# Patient Record
Sex: Male | Born: 1960 | Race: White | Hispanic: No | Marital: Married | Smoking: Never smoker
Health system: Southern US, Community
[De-identification: ages and names within clinical notes are randomized; demographics above are authoritative.]

## PROBLEM LIST (undated history)

## (undated) DIAGNOSIS — T7840XA Allergy, unspecified, initial encounter: Secondary | ICD-10-CM

## (undated) DIAGNOSIS — R011 Cardiac murmur, unspecified: Secondary | ICD-10-CM

## (undated) DIAGNOSIS — R0789 Other chest pain: Secondary | ICD-10-CM

## (undated) DIAGNOSIS — I10 Essential (primary) hypertension: Secondary | ICD-10-CM

## (undated) HISTORY — PX: MOLE REMOVAL: SHX2046

## (undated) HISTORY — DX: Other chest pain: R07.89

## (undated) HISTORY — PX: OTHER SURGICAL HISTORY: SHX169

## (undated) HISTORY — PX: COLONOSCOPY: SHX174

## (undated) HISTORY — DX: Allergy, unspecified, initial encounter: T78.40XA

## (undated) HISTORY — DX: Cardiac murmur, unspecified: R01.1

## (undated) HISTORY — DX: Essential (primary) hypertension: I10

---

## 2008-10-17 ENCOUNTER — Ambulatory Visit: Payer: Self-pay | Admitting: Internal Medicine

## 2008-10-17 DIAGNOSIS — I10 Essential (primary) hypertension: Secondary | ICD-10-CM

## 2008-10-17 DIAGNOSIS — J309 Allergic rhinitis, unspecified: Secondary | ICD-10-CM | POA: Insufficient documentation

## 2008-11-10 ENCOUNTER — Ambulatory Visit: Payer: Self-pay | Admitting: Internal Medicine

## 2008-11-10 LAB — CONVERTED CEMR LAB
BUN: 18 mg/dL (ref 6–23)
Basophils Absolute: 0 10*3/uL (ref 0.0–0.1)
Chloride: 103 meq/L (ref 96–112)
Cholesterol: 168 mg/dL (ref 0–200)
GFR calc non Af Amer: 75.6 mL/min (ref >60)
Glucose, Bld: 90 mg/dL (ref 70–99)
Glucose, Urine, Semiquant: NEGATIVE
HCT: 44.4 % (ref 39.0–52.0)
Ketones, urine, test strip: NEGATIVE
Lymphs Abs: 1.9 10*3/uL (ref 0.7–4.0)
MCV: 89.7 fL (ref 78.0–100.0)
Monocytes Absolute: 0.4 10*3/uL (ref 0.1–1.0)
Platelets: 222 10*3/uL (ref 150.0–400.0)
Potassium: 4.1 meq/L (ref 3.5–5.1)
Protein, U semiquant: NEGATIVE
RDW: 12 % (ref 11.5–14.6)
TSH: 0.8 microintl units/mL (ref 0.35–5.50)
Total Bilirubin: 1.1 mg/dL (ref 0.3–1.2)
Triglycerides: 95 mg/dL (ref 0.0–149.0)
VLDL: 19 mg/dL (ref 0.0–40.0)
pH: 5.5

## 2008-11-22 ENCOUNTER — Ambulatory Visit: Payer: Self-pay | Admitting: Internal Medicine

## 2008-11-22 DIAGNOSIS — R011 Cardiac murmur, unspecified: Secondary | ICD-10-CM | POA: Insufficient documentation

## 2008-12-04 ENCOUNTER — Encounter: Payer: Self-pay | Admitting: Internal Medicine

## 2008-12-04 ENCOUNTER — Ambulatory Visit: Payer: Self-pay

## 2009-02-21 ENCOUNTER — Ambulatory Visit: Payer: Self-pay | Admitting: Internal Medicine

## 2009-09-25 ENCOUNTER — Ambulatory Visit: Payer: Self-pay | Admitting: Internal Medicine

## 2009-09-25 LAB — CONVERTED CEMR LAB
Albumin: 4.3 g/dL (ref 3.5–5.2)
Alkaline Phosphatase: 40 units/L (ref 39–117)
Basophils Absolute: 0 10*3/uL (ref 0.0–0.1)
Blood in Urine, dipstick: NEGATIVE
CO2: 28 meq/L (ref 19–32)
Calcium: 9.3 mg/dL (ref 8.4–10.5)
Creatinine, Ser: 1.2 mg/dL (ref 0.4–1.5)
Eosinophils Absolute: 0.1 10*3/uL (ref 0.0–0.7)
Glucose, Bld: 84 mg/dL (ref 70–99)
Glucose, Urine, Semiquant: NEGATIVE
HDL: 41.9 mg/dL (ref >39.00)
Hemoglobin: 14.9 g/dL (ref 13.0–17.0)
Ketones, urine, test strip: NEGATIVE
Lymphocytes Relative: 32.6 % (ref 12.0–46.0)
Lymphs Abs: 1.6 10*3/uL (ref 0.7–4.0)
MCHC: 35.1 g/dL (ref 30.0–36.0)
MCV: 92.3 fL (ref 78.0–100.0)
Monocytes Absolute: 0.4 10*3/uL (ref 0.1–1.0)
Neutro Abs: 2.7 10*3/uL (ref 1.4–7.7)
RDW: 12.6 % (ref 11.5–14.6)
Sodium: 141 meq/L (ref 135–145)
Specific Gravity, Urine: >=1.03
TSH: 0.8 microintl units/mL (ref 0.35–5.50)
Triglycerides: 77 mg/dL (ref 0.0–149.0)
pH: 5.5

## 2009-10-09 ENCOUNTER — Ambulatory Visit: Payer: Self-pay | Admitting: Internal Medicine

## 2009-10-15 ENCOUNTER — Ambulatory Visit: Payer: Self-pay | Admitting: Cardiology

## 2009-10-15 DIAGNOSIS — E669 Obesity, unspecified: Secondary | ICD-10-CM

## 2009-10-25 ENCOUNTER — Ambulatory Visit: Payer: Self-pay | Admitting: Cardiology

## 2009-10-25 ENCOUNTER — Ambulatory Visit: Payer: Self-pay

## 2010-04-09 ENCOUNTER — Telehealth: Payer: Self-pay | Admitting: *Deleted

## 2010-05-21 NOTE — Assessment & Plan Note (Signed)
Summary: np6/ mumur, pt has state bcbs/ gd   Visit Type:  Initial Consult Primary Provider:  Madelin Headings MD  CC:  Murmur.  History of Present Illness: The patient presents for evaluation of a murmur. He was told he had this as a young child and he was evaluated. He said he eventually grew out of this. He never had any symptoms and denied any palpitations, presyncope or syncope. He had no limitations and did martial arts. More recently he has again been discovered to have a heart murmur. He apparently has gotten milder over the years. He was recently referred for an echocardiogram which demonstrated some septal hypertrophy. There was no reported transvalvular gradient and no mention of soft valvular obstruction. Septal width was 13.26 mm. There were no valvular abnormalities. He was referred for evaluation of this.  He does a lot of lifting at work but is not particularly active. He will notice shortness of breath running up a flight of stairs but has no presyncope or syncope. He has no chest pressure, neck or arm discomfort. He has no PND or orthopnea. He has no weight gain or edema.  Current Medications (verified): 1)  Lisinopril-Hydrochlorothiazide 20-12.5 Mg Tabs (Lisinopril-Hydrochlorothiazide) .Marland Kitchen.. 1 By Mouth Once Daily  or As Directed  Allergies (verified): No Known Drug Allergies  Past History:  Past Medical History: Hypertension Allergic rhinitis Heart  murmur as a child . Echo 11/2008  midl concentric  hypertrophy  no valve obstuction   Past Surgical History: Dental work done at 50 years old Cyst removal on right pinky finger 1995 Mole removal  benign   Family History: Mother: diabetes, hypertension Father: hypertension Brother: hypertension GM breast cancer  Arthrtis  Son with anxiety and ADD.  Social History: Household of 3  Works[employer], fashion/clothing.  Married Rare alcohol no tobacco  Bachelors degree  [employer]   Review of Systems       As stated in the HPI  and negative for all other systems.   Vital Signs:  Patient profile:   50 year old male Height:      70.5 inches Weight:      215 pounds BMI:     30.52 Pulse rate:   65 / minute Pulse rhythm:   regular BP sitting:   98 / 60  (left arm) Cuff size:   large  Vitals Entered By: Danielle Rankin, CMA (October 15, 2009 3:42 PM)  Physical Exam  General:  Well developed, well nourished, in no acute distress. Head:  normocephalic and atraumatic Eyes:  PERRLA/EOM intact; conjunctiva and lids normal. Mouth:  Teeth, gums and palate normal. Oral mucosa normal. Neck:  Neck supple, no JVD. No masses, thyromegaly or abnormal cervical nodes. Chest Wall:  no deformities or breast masses noted Lungs:  Clear bilaterally to auscultation and percussion. Abdomen:  Bowel sounds positive; abdomen soft and non-tender without masses, organomegaly, or hernias noted. No hepatosplenomegaly. Msk:  Back normal, normal gait. Muscle strength and tone normal. Extremities:  No clubbing or cyanosis. Neurologic:  Alert and oriented x 3. Skin:  Intact without lesions or rashes. Cervical Nodes:  no significant adenopathy Inguinal Nodes:  no significant adenopathy Psych:  Normal affect.   Detailed Cardiovascular Exam  Neck    Carotids: Carotids full and equal bilaterally without bruits.      Neck Veins: Normal, no JVD.    Heart    Inspection: no deformities or lifts noted.      Palpation: normal PMI with no thrills  palpable.      Auscultation: S1 and S2 within normal limits, no S3, no S4, no clicks, no rubs, 3/6 apical systolic murmur radiating up the aortic outflow tract and increasing with the strain phase of Valsalva  Vascular    Abdominal Aorta: no palpable masses, pulsations, or audible bruits.      Femoral Pulses: normal femoral pulses bilaterally.      Pedal Pulses: normal pedal pulses bilaterally.      Radial Pulses: normal radial pulses bilaterally.      Peripheral Circulation: no clubbing, cyanosis, or  edema noted with normal capillary refill.     Impression & Recommendations:  Problem # 1:  SYSTOLIC MURMUR (FIE-332.2)  The patient has hypertrophic cardiomyopathy with a classic murmur. There was no evidence of obstruction on echocardiography and he has no symptoms. I will bring him back to do a treadmill to look for high risk features but suspect I will follow this clinically. I gave him literature and discussed with him in detail the physiology of this. I carefully investigated for any family history and there is none.  His updated medication list for this problem includes:    Lisinopril-hydrochlorothiazide 20-12.5 Mg Tabs (Lisinopril-hydrochlorothiazide) .Marland Kitchen... 1 by mouth once daily  or as directed  Problem # 2:  HYPERTENSION (ICD-401.9)  His updated medication list for this problem includes:    Lisinopril-hydrochlorothiazide 20-12.5 Mg Tabs (Lisinopril-hydrochlorothiazide) .Marland Kitchen... 1 by mouth once daily  or as directed  Problem # 3:  OBESITY, UNSPECIFIED (ICD-278.00) His BMI puts him in a mildly obese range and we discussed this. We will discuss this further at the time of his treadmill.  Patient Instructions: 1)  Your physician has requested that you have an exercise tolerance test.  For further information please visit [email or web address]  Please also follow instruction sheet, as given.   first available with Dr. Antoine Poche. 2)  Hypertrophic Cardiomyopathy 3)  Please visit  [email or web address]  for detailed information on

## 2010-05-21 NOTE — Assessment & Plan Note (Signed)
Summary: cpx//lh RSC BMP OK PER KIM/NJR   Vital Signs:  Patient profile:   50 year old male Height:      70.5 inches Weight:      213 pounds BMI:     30.24 Pulse rate:   78 / minute BP sitting:   120 / 60  (right arm) Cuff size:   regular  Vitals Entered By: Romualdo Bolk, CMA (AAMA) (October 09, 2009 9:41 AM)  Nutrition Counseling: Patient's BMI is greater than 25 and therefore counseled on weight management options. CC: CPX   History of Present Illness: Paul Yoder comes in today  for preventive visit  Has a BS form to complete.  Ankle sprain   with weight gain from decrease exercise .  recently  doing better .  Had done well with weight loss with weight watchers in the past . But gained back  No new concerns otherwise . Going to [employer] camp (not high adventure. ) average activity  HT: med doing well  readings good and  no se discussed.   Preventive Care Screening  Prior Values:    Last Tetanus Booster:  Tdap (10/17/2008)   Preventive Screening-Counseling & Management  Alcohol-Tobacco     Alcohol drinks/day: <1     Alcohol type: wine     Smoking Status: never  Caffeine-Diet-Exercise     Caffeine use/day: one a week     Does Patient Exercise: yes     Type of exercise: walking  Hep-HIV-STD-Contraception     Dental Visit-last 6 months yes     Sun Exposure-Excessive: no  Safety-Violence-Falls     Seat Belt Use: yes     Firearms in the Home: firearms in the home     Firearm Counseling: not indicated; uses recommended firearm safety measures     Smoke Detectors: yes  Current Medications (verified): 1)  Lisinopril-Hydrochlorothiazide 20-12.5 Mg Tabs (Lisinopril-Hydrochlorothiazide) .Marland Kitchen.. 1 By Mouth Once Daily  or As Directed  Allergies (verified): No Known Drug Allergies  Past History:  Past medical, surgical, family and social histories (including risk factors) reviewed, and no changes noted (except as noted below).  Past Medical  History: Hypertension Allergic rhinitis heart   murmur as a child . Echo 11/2008  midl concentric  hypertrophy  no valve obstuction   Past Surgical History: Reviewed history from 11/22/2008 and no changes required. dental work done at 50 years old cyst removal on right pinky finger 1995 mole removal  benign   Past History:  Care Management: Dermatology: Posey Rea of name- couple of years ago  Family History: Reviewed history from 10/17/2008 and no changes required. mother: diabetes, hypertension father: hypertension brother: hypertension GM breast cancer  Arthrtis  Son with anxiety and ADD.  Social History: Reviewed history from 10/17/2008 and no changes required. 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  Married rare alcohol no tobacco  Bachelors degree  Scouting   Review of Systems  The patient denies anorexia, fever, weight loss, vision loss, decreased hearing, hoarseness, chest pain, syncope, peripheral edema, prolonged cough, abdominal pain, melena, hematochezia, severe indigestion/heartburn, hematuria, incontinence, genital sores, muscle weakness, suspicious skin lesions, transient blindness, difficulty walking, unusual weight change, abnormal bleeding, enlarged lymph nodes, and testicular masses.         no cp sob except after gaining weigh related to wearing tighter fittingclothes  Physical Exam General Appearance: well developed, well nourished, no acute distress Eyes: conjunctiva and lids normal, PERRLA, EOMI, WNL Ears, Nose, Mouth, Throat: TM clear, nares  clear, oral exam WNL glasses  Neck: supple, no lymphadenopathy, no thyromegaly, no JVD Respiratory: clear to auscultation and percussion, respiratory effort normal Cardiovascular: regular rate and rhythm, S1-S2, Pan systolic murmur  through out precordium without change with  postion , not to neck , no  rub or gallop, no bruits, peripheral pulses intact  not particulalry bounding  and symmetric, no cyanosis, clubbing,  edema or varicosities no jvd Chest: no scars, masses, tenderness; no asymmetry, skin changes, nipple discharge, no gynecomastia   Gastrointestinal: soft, non-tender; no hepatosplenomegaly, masses; active bowel sounds all quadrants, ; no masses, tenderness, perianal tag  Genitourinary: no hernia, testicular mass, penile discharge, priapism or prostate enlargement Lymphatic: no cervical, axillary or inguinal adenopathy Musculoskeletal: gait normal, muscle tone and strength WNL, no joint swelling, effusions, discoloration, crepitus  Skin: clear, good turgor, color WNL, no rashes, lesions, or ulcerations Neurologic: normal mental status, normal reflexes, normal strength, sensation, and motion Psychiatric: alert; oriented to person, place and time Other Exam:  see labs     Impression & Recommendations:  Problem # 1:  HEALTH MAINTENANCE EXAM (ICD-V70.0) Discussed nutrition,exercise,diet,healthy weight, vitamin D and calcium.    agree with weight loss  to help Cv risk and health. Can call urology for vascectomy.  contact us if needed about this.   Problem # 2:  HYPERTENSION (ICD-401.9) Assessment: Unchanged  His updated medication list for this problem includes:    Lisinopril-hydrochlorothiazide 20-12.5 Mg Tabs (Lisinopril-hydrochlorothiazide) .Marland Kitchen... 1 by mouth once daily  or as directed  BP today: 120/60 Prior BP: 100/70 (02/21/2009)  Prior 10 Yr Risk Heart Disease: 7 % (02/21/2009)  Labs Reviewed: K+: 4.4 (09/25/2009) Creat: : 1.2 (09/25/2009)   Chol: 193 (09/25/2009)   HDL: 41.90 (09/25/2009)   LDL: 136 (09/25/2009)   TG: 77.0 (09/25/2009)  Problem # 3:  SYSTOLIC MURMUR (UEA-540.2) Assessment: Deteriorated exam of murmur  is now impressive and heard throughout  chest  in all positions but nl pulse   and no thrill   . no obstructive sx by hx  Last  echo was august 2010 . Needs further evaluation  / cardiology referral.  Orders: Cardiology Referral (Cardiology)  Complete Medication  List: 1)  Lisinopril-hydrochlorothiazide 20-12.5 Mg Tabs (Lisinopril-hydrochlorothiazide) .Marland Kitchen.. 1 by mouth once daily  or as directed  Patient Instructions: 1)  since your murmur is more prominent rec see cardiology.  2)  Routine acitivity is ok 3)  agree with weight loss  weight watchers   program. 4)  Continue  bp medication 5)  colonscopy   screening   rec when turning 49 years   6)  can call for referral when convenient.  7)  Plan rov in 6 months for BP   and as per cardiology rec.

## 2010-05-23 NOTE — Progress Notes (Signed)
Summary: refill  Phone Note Refill Request Call back at Home Phone [phone] Message from:  Patient---live call  Refills Requested: Medication #1:  LISINOPRIL-HYDROCHLOROTHIAZIDE 20-12.5 MG TABS 1 by mouth once daily  or as directed. [facility]  Initial call taken by: Warnell Forester,  April 09, 2010 1:32 PM  Follow-up for Phone Call        Rx sent to pharmacy. Left message for pt to call back. Pt needs to schedule a follow up appt. Follow-up by: Romualdo Bolk, CMA Duncan Dull),  April 09, 2010 3:51 PM  Additional Follow-up for Phone Call Additional follow up Details #1::        Pt aware and will call back to schedule a follow up on bp Additional Follow-up by: Romualdo Bolk, CMA (AAMA),  April 17, 2010 12:35 PM    Prescriptions: LISINOPRIL-HYDROCHLOROTHIAZIDE 20-12.5 MG TABS (LISINOPRIL-HYDROCHLOROTHIAZIDE) 1 by mouth once daily  or as directed  #90 x 0   Entered by:   Romualdo Bolk, CMA (AAMA)   Authorized by:   Madelin Headings MD   Signed by:   Romualdo Bolk, CMA (AAMA) on 04/09/2010   Method used:   Electronically to        [id number] (retail)       [address]       [place], [place]  [address]       Ph: [phone]       Fax: [phone]   RxID:   [id number]

## 2010-07-05 ENCOUNTER — Encounter: Payer: Self-pay | Admitting: Internal Medicine

## 2010-07-12 ENCOUNTER — Encounter: Payer: Self-pay | Admitting: Internal Medicine

## 2010-07-12 ENCOUNTER — Ambulatory Visit (INDEPENDENT_AMBULATORY_CARE_PROVIDER_SITE_OTHER): Payer: BC Managed Care – PPO | Admitting: Internal Medicine

## 2010-07-12 VITALS — BP 150/94 | HR 60 | Wt 215.0 lb

## 2010-07-12 DIAGNOSIS — I10 Essential (primary) hypertension: Secondary | ICD-10-CM

## 2010-07-12 DIAGNOSIS — E669 Obesity, unspecified: Secondary | ICD-10-CM

## 2010-07-12 DIAGNOSIS — I422 Other hypertrophic cardiomyopathy: Secondary | ICD-10-CM

## 2010-07-12 MED ORDER — LISINOPRIL 10 MG PO TABS: 10.0000 mg | ORAL_TABLET | Freq: Every day | ORAL | Status: DC

## 2010-07-12 NOTE — Patient Instructions (Addendum)
Check bp readings as directed . Low dose  Me as discussed  Losing weight will help. Cpx in June    2012  With labs  Call in meantime  if needed .

## 2010-07-12 NOTE — Assessment & Plan Note (Signed)
Counseled consider weight watchers on line others

## 2010-07-12 NOTE — Progress Notes (Signed)
  Subjective:    Patient ID: Paul Yoder, male    DOB: 1960-09-14, 50 y.o.   MRN: [id number]  HPI Pt comes in today for above followup of blood pressure medication. Since his last visit in the summer he has seen Dr. Ivette Loyal crying in regard to his heart murmur and was confirmed diagnosed as mild HCM   Without symptoms with  negative ETT.   Since that time he has decreased his blood pressure medication and then stopped it. He was taking a decreased dose but taking it every other day. His blood pressure readings were 120/80 on the medication. However he stopped it a month ago because he ran out and hasn't checked his readings down but he feels well.   He hasn't been able to  Lose the weight but plans on trying. Past Medical History  Diagnosis Date  . Allergy     seasonal  . Hypertension   . Heart murmur     HCM mild neg ett 2011   History reviewed. No pertinent past surgical history.  reports that he has never smoked. He does not have any smokeless tobacco history on file. He reports that he drinks alcohol. His drug history not on file. family history includes ADD / ADHD in his son; Anxiety disorder in his son; Diabetes in his mother; and Hypertension in his brother and father. No Known Allergies    Review of Systems  negative for chest pain shortness of breath exercise intolerance no palpitations or syncope. No change in GI habits. No unusual bleeding.    Objective:   Physical Exam Wt Readings from Last 3 Encounters:  07/12/10 215 lb (97.523 kg)  10/15/09 215 lb (97.523 kg)  10/09/09 213 lb (96.616 kg)    well-developed well-nourished in no acute distress HEENT is grossly normal neck supple without masses  Or bruits. Chest:  Clear to A&P without wheezes rales or rhonchi CV:  S1-S2 no gallops  peripheral perfusion is normal There is a 2/6 mid to late systolic murmur at the left upper sternal border but possibly increases with Valsalva. More in upright position. No thrills.   No  clubbing cyanosis or edema  Abdomen:  Sof,t normal bowel sounds without hepatosplenomegaly, no guarding rebound or masses no CVA tenderness Neuro non focal          Assessment & Plan:   hypertension:     Readings her back up somewhat since he has gone off the medication. He hasn't lost weight which could help control. We had a significant conversation about medication choices and risk and benefit of using diuretics and his HCM condition. And even ACE inhibitors may be an issue at times. We discussed possibility of beta blockers and for  Verapamil if needed however he has no symptoms with his condition.   obesity by BMI     Discussed strategies exercise possibly Weight Watchers. Losing weight will help his condition.   hypertrophiccardiomyopathy   Asymptomatic we'll get followup with cardiology by echo yearly. Or as appropriate    More than 50% of visit  Was spent in counseling  25 minutes

## 2010-07-12 NOTE — Assessment & Plan Note (Signed)
From HCM

## 2010-07-12 NOTE — Assessment & Plan Note (Signed)
Echo with mild septal hypertrophy and no sx .  ETT showed good tolerance and no sx last summer 2011 . He had been on Lis/ hctz 20 12.5  And bp good.  Took every other day to decrease dose  And then off for 1 months. Neverhad any sx of palpitations syncope or. Disc risk benefit to have caution  With diuretics and even ace .  Vs b blockers and verapamil .    But will see how bp control is on low dose ace.  He will lose weight intensify lsi .    And close   Follow up.

## 2010-09-27 ENCOUNTER — Telehealth: Payer: Self-pay | Admitting: Internal Medicine

## 2010-09-27 NOTE — Telephone Encounter (Signed)
Pt is needing to get a camp cpx before 10/20/10. Pls advise.

## 2010-09-27 NOTE — Telephone Encounter (Signed)
Per Dr. Fabian Sharp- okay to do 10/18/10 at 9:15am Pt aware of appt.

## 2010-10-18 ENCOUNTER — Ambulatory Visit (INDEPENDENT_AMBULATORY_CARE_PROVIDER_SITE_OTHER): Payer: BC Managed Care – PPO | Admitting: Internal Medicine

## 2010-10-18 ENCOUNTER — Encounter: Payer: Self-pay | Admitting: Internal Medicine

## 2010-10-18 VITALS — BP 130/86 | HR 60 | Ht 70.75 in | Wt 210.0 lb

## 2010-10-18 DIAGNOSIS — I422 Other hypertrophic cardiomyopathy: Secondary | ICD-10-CM

## 2010-10-18 DIAGNOSIS — Z1211 Encounter for screening for malignant neoplasm of colon: Secondary | ICD-10-CM

## 2010-10-18 DIAGNOSIS — I10 Essential (primary) hypertension: Secondary | ICD-10-CM

## 2010-10-18 DIAGNOSIS — Z0000 Encounter for general adult medical examination without abnormal findings: Secondary | ICD-10-CM

## 2010-10-18 DIAGNOSIS — E669 Obesity, unspecified: Secondary | ICD-10-CM

## 2010-10-18 MED ORDER — LISINOPRIL 10 MG PO TABS: 10.0000 mg | ORAL_TABLET | Freq: Every day | ORAL | Status: DC

## 2010-10-18 NOTE — Progress Notes (Signed)
  Subjective:    Patient ID: Paul Yoder, male    DOB: 05/22/1960, 50 y.o.   MRN: [id number]  HPI  Patient  Comes in for PV  And fu of other issues . 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 Since last visit has been well but  Had ed visit for a pulled chest wall muscle  When reached back at work .   Had bad muscle spasm. Then had early am cp sob so went to [facility]  ed .   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   Was given Morphine in ed.  And got resp depression and bradycardia.  27 .  rec to need 1/2 dose of morphine if needed in the future.   Review of Systems ROS:  GEN/ HEENTNo fever, significant weight changes sweats headaches vision problems hearing changes, CV/ PULM; No currrent chest pain  shortness of breath cough, syncope,edema  change in exercise tolerance. GI /GU: No adominal pain, vomiting, change in bowel habits. No blood in the stool. No significant GU symptoms. SKIN/HEME: ,no acute skin rashes suspicious lesions or bleeding. No lymphadenopathy, nodules, masses.  NEURO/ PSYCH:  No neurologic signs such as weakness numbness No depression anxiety. IMM/ Allergy: No unusual infections.  Allergy .   REST of 12 system review negative     Objective:   Physical Exam Physical Exam: Vital signs reviewed WUJ:WJXB is a well-developed well-nourished alert cooperative  White male  who appears   stated age in no acute distress.  HEENT: normocephalic  traumatic , Eyes: PERRL EOM's full, conjunctiva clear, Nares: patent no deformity discharge or tenderness., Ears: no deformity EAC's clear TMs with normal landmarks. Mouth: clear OP, no lesions, edema.  Moist mucous membranes. Dentition in adequate repair. NECK: supple without masses, thyromegaly or bruits. CHEST/PULM:  Clear to auscultation and percussion breath sounds equal no wheeze , rales or rhonchi. No chest wall deformities or tenderness. CV: PMI is nondisplaced, S1 S2 no gallops,, rubs. Faint systolic m heard in sitting postions  No radiation Peripheral  pulses are full without delay.No JVD .  ABDOMEN: Bowel sounds normal nontender  No guard or rebound, no hepato splenomegal no CVA tenderness.  No hernia. Extremtities:  No clubbing cyanosis or edema, no acute joint swelling or redness no focal atrophy NEURO:  Oriented x3, cranial nerves 3-12 appear to be intact, no obvious focal weakness,gait within normal limits no abnormal reflexes or asymmetrical SKIN: No acute rashes normal turgor, color, no bruising or petechiae. PSYCH: Oriented, good eye contact, no obvious depression anxiety, cognition and judgment appear normal. LN:  No cervical axillary or inguinal adenopathy Rectal hemm tag and  Nl prostate no nodule or masses     Assessment & Plan:  Preventive Health Care Counseled regarding healthy nutrition, exercise, sleep, injury prevention, calcium vit d and healthy weight . Due for colonsocopy will refer.  HBP   Continue low dose lisinopril  Every day  Continue to monitor. HX of cpwp.  Hx of se of iv morphine   Pt aware would use small dose if needed if future HCM mild asymptomatic with cards eval     Form signed for BS camp

## 2010-10-18 NOTE — Patient Instructions (Signed)
Continue monitoring   Medication  Schedule for fasting CPX labs when convenient and will let you know results . If ok then ROV in 6 months  Will do colonoscopy referral.

## 2010-12-24 ENCOUNTER — Encounter: Payer: Self-pay | Admitting: Gastroenterology

## 2011-09-09 ENCOUNTER — Telehealth: Payer: Self-pay | Admitting: Internal Medicine

## 2011-09-09 NOTE — Telephone Encounter (Signed)
Pt called and is needing to get a work in cpx for first wk in July. Pls advise.

## 2011-09-10 NOTE — Telephone Encounter (Signed)
Pls advise.  

## 2011-09-11 NOTE — Telephone Encounter (Signed)
Unsure why we are having to ask Wednesday is open as a free schedule  And can use this date  Otherwise can put in for Tuesday July 2nd

## 2011-09-12 NOTE — Telephone Encounter (Signed)
Pls schedule

## 2011-09-12 NOTE — Telephone Encounter (Signed)
Pt returned call and has been schd for work in cpx on 10/21/11 with labs prior to ov, as noted.

## 2011-09-12 NOTE — Telephone Encounter (Signed)
Called and lft vm for pt re: work in cpx in July as noted. Waiting on call back.

## 2011-10-03 ENCOUNTER — Telehealth: Payer: Self-pay | Admitting: Internal Medicine

## 2011-10-03 NOTE — Telephone Encounter (Signed)
Pt req to get labs that were schedule on 10/10/11, to be done at the [facility] office. Closer to patient home.

## 2011-10-06 NOTE — Telephone Encounter (Signed)
Tried reaching the pt.  Left message on voicemail.  The pt need to be notified that he will need to come here or [facility] for his labs.  Another option would be that he either come pick up the orders and go to [employer] or another company like that or we can fax the orders to them.

## 2011-10-07 NOTE — Telephone Encounter (Signed)
Spoke to the pt's wife, Cala Bradford.  Informed her that the pt will need to come here for his labs.  She also wanted to move the lab time to an earlier appt on Friday but we were unable to accomodate.

## 2011-10-10 ENCOUNTER — Other Ambulatory Visit (INDEPENDENT_AMBULATORY_CARE_PROVIDER_SITE_OTHER): Payer: BC Managed Care – PPO

## 2011-10-10 ENCOUNTER — Other Ambulatory Visit: Payer: BC Managed Care – PPO

## 2011-10-10 DIAGNOSIS — Z0000 Encounter for general adult medical examination without abnormal findings: Secondary | ICD-10-CM

## 2011-10-10 LAB — POCT URINALYSIS DIPSTICK
Bilirubin, UA: NEGATIVE
Blood, UA: NEGATIVE
Glucose, UA: NEGATIVE
Ketones, UA: NEGATIVE
Nitrite, UA: NEGATIVE
Spec Grav, UA: >=1.03

## 2011-10-10 LAB — TSH: TSH: 0.85 u[IU]/mL (ref 0.35–5.50)

## 2011-10-10 LAB — CBC WITH DIFFERENTIAL/PLATELET
Basophils Absolute: 0 10*3/uL (ref 0.0–0.1)
Eosinophils Absolute: 0.2 10*3/uL (ref 0.0–0.7)
HCT: 43.6 % (ref 39.0–52.0)
Hemoglobin: 14.5 g/dL (ref 13.0–17.0)
Lymphs Abs: 1.2 10*3/uL (ref 0.7–4.0)
MCHC: 33.4 g/dL (ref 30.0–36.0)
Monocytes Absolute: 0.3 10*3/uL (ref 0.1–1.0)
Neutro Abs: 2.4 10*3/uL (ref 1.4–7.7)
Platelets: 182 10*3/uL (ref 150.0–400.0)
RDW: 13.1 % (ref 11.5–14.6)

## 2011-10-10 LAB — BASIC METABOLIC PANEL
BUN: 17 mg/dL (ref 6–23)
CO2: 27 mEq/L (ref 19–32)
GFR: 83.4 mL/min (ref >60.00)
Glucose, Bld: 86 mg/dL (ref 70–99)
Potassium: 4.3 mEq/L (ref 3.5–5.1)
Sodium: 139 mEq/L (ref 135–145)

## 2011-10-10 LAB — PSA: PSA: 2.24 ng/mL (ref 0.10–4.00)

## 2011-10-10 LAB — HEPATIC FUNCTION PANEL
Albumin: 4 g/dL (ref 3.5–5.2)
Total Bilirubin: 0.5 mg/dL (ref 0.3–1.2)

## 2011-10-10 LAB — LIPID PANEL
Cholesterol: 151 mg/dL (ref 0–200)
HDL: 37.3 mg/dL — ABNORMAL LOW (ref >39.00)
Triglycerides: 96 mg/dL (ref 0.0–149.0)
VLDL: 19.2 mg/dL (ref 0.0–40.0)

## 2011-10-21 ENCOUNTER — Ambulatory Visit (INDEPENDENT_AMBULATORY_CARE_PROVIDER_SITE_OTHER): Payer: BC Managed Care – PPO | Admitting: Internal Medicine

## 2011-10-21 ENCOUNTER — Encounter: Payer: Self-pay | Admitting: Internal Medicine

## 2011-10-21 VITALS — BP 136/90 | HR 89 | Temp 98.8°F | Ht 70.75 in | Wt 214.0 lb

## 2011-10-21 DIAGNOSIS — I10 Essential (primary) hypertension: Secondary | ICD-10-CM

## 2011-10-21 DIAGNOSIS — E669 Obesity, unspecified: Secondary | ICD-10-CM

## 2011-10-21 DIAGNOSIS — Z0000 Encounter for general adult medical examination without abnormal findings: Secondary | ICD-10-CM

## 2011-10-21 DIAGNOSIS — Z1211 Encounter for screening for malignant neoplasm of colon: Secondary | ICD-10-CM

## 2011-10-21 DIAGNOSIS — R030 Elevated blood-pressure reading, without diagnosis of hypertension: Secondary | ICD-10-CM

## 2011-10-21 NOTE — Patient Instructions (Addendum)
Continue lifestyle intervention healthy eating and exercise . Record and send in BP readings  Would prefer that y ou take a lower dose of med every day then off and on at this time. lisinopril comes in a 5 mg  Pill also.  Someone will contact  You about  colonoscopy referral.   ROV  In 4- 6 months about Bp or as needed.

## 2011-10-21 NOTE — Progress Notes (Signed)
Subjective:    Patient ID: Paul Yoder, male    DOB: 1960/10/15, 51 y.o.   MRN: [id number]  HPI Patient comes in today for preventive visit and follow-up of medical issues. Update  history since  last visit: no major injuries change in health  Has bs form for leader .   No cv pulm sx  Has been takings the prinivil 5 days a week sometimes . See prev CV evaluations. Review of Systems ROS:  GEN/ HEENT: No fever, significant weight changes sweats headaches vision problems hearing changes, CV/ PULM; No chest pain shortness of breath cough, syncope,edema  change in exercise tolerance. GI /GU: No adominal pain, vomiting, change in bowel habits. No blood in the stool. No significant GU symptoms. SKIN/HEME: ,no acute skin rashes suspicious lesions or bleeding. No lymphadenopathy, nodules, masses.  NEURO/ PSYCH:  No neurologic signs such as weakness numbness. No depression anxiety. IMM/ Allergy: No unusual infections.  Allergy .   REST of 12 system review negative except as per HPI Outpatient Encounter Prescriptions as of 10/21/2011  Medication Sig Dispense Refill  . lisinopril (PRINIVIL,ZESTRIL) 10 MG tablet Take 1 tablet (10 mg total) by mouth daily. PT takes Monday thru Friday and not on the weekends  90 tablet  3   Past history family history social history reviewed in the electronic medical record.     Objective:   Physical Exam BP 136/90  Pulse 89  Temp 98.8 F (37.1 C) (Oral)  Ht 5' 10.75" (1.797 m)  Wt 214 lb (97.07 kg)  BMI 30.06 kg/m2  SpO2 98%  Repeat bp 132/80 140/88  Wt Readings from Last 3 Encounters:  10/21/11 214 lb (97.07 kg)  10/18/10 210 lb (95.255 kg)  07/12/10 215 lb (97.523 kg)    Physical Exam: Vital signs reviewed NWG:NFAO is a well-developed well-nourished alert cooperative  White male  who appears   stated age in no acute distress.  HEENT: normocephalic  traumatic , Eyes: PERRL EOM's full, conjunctiva clear, Nares: patent no deformity discharge or  tenderness., Ears: no deformity EAC's clear TMs with normal landmarks. Mouth: clear OP, no lesions, edema.  Moist mucous membranes. Dentition in adequate repair. NECK: supple without masses, thyromegaly or bruits. CHEST/PULM:  Clear to auscultation and percussion breath sounds equal no wheeze , rales or rhonchi. No chest wall deformities or tenderness. CV: PMI is nondisplaced, S1 S2 no gallops, murmurs, rubs. Peripheral pulses are full without delay.No JVD .  ABDOMEN: Bowel sounds normal nontender  No guard or rebound, no hepato splenomegal no CVA tenderness.  No hernia. Extremtities:  No clubbing cyanosis or edema, no acute joint swelling or redness no focal atrophy NEURO:  Oriented x3, cranial nerves 3-12 appear to be intact, no obvious focal weakness,gait within normal limits no abnormal reflexes or asymmetrical SKIN: No acute rashes normal turgor, color, no bruising or petechiae. PSYCH: Oriented, good eye contact, no obvious depression anxiety, cognition and judgment appear normal. LN:  No cervical axillary or inguinal adenopathy   Lab Results  Component Value Date   WBC 4.2* 10/10/2011   HGB 14.5 10/10/2011   HCT 43.6 10/10/2011   PLT 182.0 10/10/2011   GLUCOSE 86 10/10/2011   CHOL 151 10/10/2011   TRIG 96.0 10/10/2011   HDL 37.30* 10/10/2011   LDLCALC 95 10/10/2011   ALT 25 10/10/2011   AST 21 10/10/2011   NA 139 10/10/2011   K 4.3 10/10/2011   CL 105 10/10/2011   CREATININE 1.0 10/10/2011   BUN 17 10/10/2011  CO2 27 10/10/2011   TSH 0.85 10/10/2011   PSA 2.24 10/10/2011       Assessment & Plan:  Preventive Health Care Counseled regarding healthy nutrition, exercise, sleep, injury prevention, calcium vit d and healthy weight . Needs colonsocopy.  HT mildly elevation unsure why only taking 5 days per week and would prob do better with 5 mg qd or such  Discussed this with pt.  lsi important .  Mild HCM very mild   No obstructive  Problem  Felt secondary to HT See cards note .Marland Kitchen  LSI  advised. With weight loss and bp control. [employer] form completed and signed.

## 2011-10-26 DIAGNOSIS — I10 Essential (primary) hypertension: Secondary | ICD-10-CM | POA: Insufficient documentation

## 2011-10-26 DIAGNOSIS — Z0000 Encounter for general adult medical examination without abnormal findings: Secondary | ICD-10-CM | POA: Insufficient documentation

## 2011-10-26 DIAGNOSIS — E669 Obesity, unspecified: Secondary | ICD-10-CM | POA: Insufficient documentation

## 2011-12-29 ENCOUNTER — Encounter: Payer: Self-pay | Admitting: Internal Medicine

## 2012-01-06 ENCOUNTER — Other Ambulatory Visit: Payer: Self-pay | Admitting: Internal Medicine

## 2012-07-09 ENCOUNTER — Encounter: Payer: BC Managed Care – PPO | Admitting: Internal Medicine

## 2012-08-20 ENCOUNTER — Other Ambulatory Visit (INDEPENDENT_AMBULATORY_CARE_PROVIDER_SITE_OTHER): Payer: BC Managed Care – PPO

## 2012-08-20 DIAGNOSIS — Z0000 Encounter for general adult medical examination without abnormal findings: Secondary | ICD-10-CM

## 2012-08-20 LAB — CBC WITH DIFFERENTIAL/PLATELET
Basophils Absolute: 0 10*3/uL (ref 0.0–0.1)
Eosinophils Absolute: 0.1 10*3/uL (ref 0.0–0.7)
HCT: 44.5 % (ref 39.0–52.0)
Hemoglobin: 15.5 g/dL (ref 13.0–17.0)
MCV: 89.2 fl (ref 78.0–100.0)
Monocytes Relative: 6.8 % (ref 3.0–12.0)
Neutro Abs: 4.8 10*3/uL (ref 1.4–7.7)
Neutrophils Relative %: 69.2 % (ref 43.0–77.0)
RBC: 4.99 Mil/uL (ref 4.22–5.81)
WBC: 6.9 10*3/uL (ref 4.5–10.5)

## 2012-08-20 LAB — TSH: TSH: 1.06 u[IU]/mL (ref 0.35–5.50)

## 2012-08-23 LAB — BASIC METABOLIC PANEL
Chloride: 102 mEq/L (ref 96–112)
Potassium: 5.3 mEq/L — ABNORMAL HIGH (ref 3.5–5.1)
Sodium: 136 mEq/L (ref 135–145)

## 2012-08-23 LAB — LIPID PANEL
Cholesterol: 183 mg/dL (ref 0–200)
HDL: 43 mg/dL (ref >39.00)
LDL Cholesterol: 115 mg/dL — ABNORMAL HIGH (ref 0–99)
Total CHOL/HDL Ratio: 4
Triglycerides: 123 mg/dL (ref 0.0–149.0)

## 2012-08-23 LAB — HEPATIC FUNCTION PANEL
AST: 24 U/L (ref 0–37)
Albumin: 4.5 g/dL (ref 3.5–5.2)
Alkaline Phosphatase: 43 U/L (ref 39–117)
Total Bilirubin: 0.5 mg/dL (ref 0.3–1.2)

## 2012-08-25 ENCOUNTER — Ambulatory Visit (INDEPENDENT_AMBULATORY_CARE_PROVIDER_SITE_OTHER): Payer: BC Managed Care – PPO | Admitting: Internal Medicine

## 2012-08-25 ENCOUNTER — Encounter: Payer: Self-pay | Admitting: Internal Medicine

## 2012-08-25 VITALS — BP 120/80 | HR 78 | Temp 98.1°F | Ht 70.5 in | Wt 216.0 lb

## 2012-08-25 DIAGNOSIS — Z1211 Encounter for screening for malignant neoplasm of colon: Secondary | ICD-10-CM

## 2012-08-25 DIAGNOSIS — J309 Allergic rhinitis, unspecified: Secondary | ICD-10-CM

## 2012-08-25 DIAGNOSIS — I10 Essential (primary) hypertension: Secondary | ICD-10-CM

## 2012-08-25 DIAGNOSIS — I422 Other hypertrophic cardiomyopathy: Secondary | ICD-10-CM

## 2012-08-25 DIAGNOSIS — E669 Obesity, unspecified: Secondary | ICD-10-CM

## 2012-08-25 DIAGNOSIS — E875 Hyperkalemia: Secondary | ICD-10-CM

## 2012-08-25 DIAGNOSIS — Z0000 Encounter for general adult medical examination without abnormal findings: Secondary | ICD-10-CM

## 2012-08-25 NOTE — Progress Notes (Signed)
Chief Complaint  Patient presents with  . Annual Exam    HPI: Patient comes in today for Preventive Health Care visit  Has a form for [employer]. Since his last visit he thinks his blood pressures been doing pretty well his only taking lisinopril during the weekday is thought that he might not needed on the weekends. No side effects of medication Bp   Ok no se no chest pain shortness of breath or syncope. Some allergy symptoms but no cough  Pollen eye sx with pollen.  Sneezing  Hasn't had colonoscopy screening yet  ROS:  GEN/ HEENT: No fever, significant weight changes sweats headaches vision problems hearing changes, CV/ PULM; No chest pain shortness of breath cough, syncope,edema  change in exercise tolerance. GI /GU: No adominal pain, vomiting, change in bowel habits. No blood in the stool. No significant GU symptoms. SKIN/HEME: ,no acute skin rashes suspicious lesions or bleeding. No lymphadenopathy, nodules, masses.  NEURO/ PSYCH:  No neurologic signs such as weakness numbness. No depression anxiety. IMM/ Allergy: No unusual infections.  Allergy .   REST of 12 system review negative except as per HPI   Past Medical History  Diagnosis Date  . Allergy     seasonal  . Hypertension   . Heart murmur     HCM mild neg ett 2011  . Chest wall pain     ed eval HP ed  had resp depression with morphine IV>   Past Surgical History  Procedure Laterality Date  . Mole removal    . Other surgical history      finger cyst removal     Family History  Problem Relation Age of Onset  . Diabetes Mother   . Hypertension Father   . Hypertension Brother   . ADD / ADHD Son   . Anxiety disorder Son     History   Social History  . Marital Status: Married    Spouse Name: N/A    Number of Children: N/A  . Years of Education: N/A   Social History Main Topics  . Smoking status: Never Smoker   . Smokeless tobacco: None  . Alcohol Use: Yes  . Drug Use:   . Sexually Active:    Other  Topics Concern  . None   Social History Narrative   HHof 3   Son pet dog    worksite Merchandiser, retail   Married BS    Active in scouting     Outpatient Encounter Prescriptions as of 08/25/2012  Medication Sig Dispense Refill  . lisinopril (PRINIVIL,ZESTRIL) 10 MG tablet TAKE 1 TABLET BY MOUTH DAILY (MONDAY THRU FRIDAY AND NOT ON THE WEEKENDS)  90 tablet  1   No facility-administered encounter medications on file as of 08/25/2012.    EXAM: Wt Readings from Last 3 Encounters:  08/25/12 216 lb (97.977 kg)  10/21/11 214 lb (97.07 kg)  10/18/10 210 lb (95.255 kg)    BP 120/80  Pulse 78  Temp(Src) 98.1 F (36.7 C) (Oral)  Ht 5' 10.5" (1.791 m)  Wt 216 lb (97.977 kg)  BMI 30.54 kg/m2  SpO2 98%  Body mass index is 30.54 kg/(m^2).  Physical Exam: Vital signs reviewed NWG:NFAO is a well-developed well-nourished alert cooperative   male who appears her stated age in no acute distress.  HEENT: normocephalic atraumatic , Eyes: PERRL EOM's full, conjunctiva clear, Nares: paten,t no deformity discharge or tenderness., Ears: no deformity EAC's clear TMs with normal landmarks. Mouth: clear OP, no lesions, edema.  Moist mucous membranes. Dentition in adequate repair. NECK: supple without masses, thyromegaly or bruits. CHEST/PULM:  Clear to auscultation and percussion breath sounds equal no wheeze , rales or rhonchi. No chest wall deformities or tenderness. CV: PMI is nondisplaced, S1 S2 no gallops,  rubs. There is a 2-3/6 systolic murmur along the left sternal border up into the chest that is worse with standing. No thrills are noted Peripheral pulses are full without delay.No JVD .  ABDOMEN: Bowel sounds normal nontender  No guard or rebound, no hepato splenomegal no CVA tenderness.  Extremtities:  No clubbing cyanosis or edema, no acute joint swelling or redness no focal atrophy NEURO:  Oriented x3, cranial nerves 3-12 appear to be intact, no obvious focal weakness,gait within normal limits no  abnormal reflexes or asymmetrical SKIN: No acute rashes normal turgor, color, no bruising or petechiae. PSYCH: Oriented, good eye contact, no obvious depression anxiety, cognition and judgment appear normal. LN: no cervical axillary inguinal adenopathy Rectal exam prostate normal size to +1 no nodules stool heme-negative  Lab Results  Component Value Date   WBC 6.9 08/20/2012   HGB 15.5 08/20/2012   HCT 44.5 08/20/2012   PLT 226.0 08/20/2012   GLUCOSE 100* 08/20/2012   CHOL 183 08/20/2012   TRIG 123.0 08/20/2012   HDL 43.00 08/20/2012   LDLCALC 115* 08/20/2012   ALT 29 08/20/2012   AST 24 08/20/2012   NA 136 08/20/2012   K 5.3* 08/20/2012   CL 102 08/20/2012   CREATININE 1.2 08/20/2012   BUN 19 08/20/2012   CO2 26 08/20/2012   TSH 1.06 08/20/2012   PSA 2.45 08/20/2012    ASSESSMENT AND PLAN:  Discussed the following assessment and plan:  Visit for preventive health examination  HYPERTENSION - on low dose acei  ALLERGIC RHINITIS  Hypertrophic cardiomyopathy - doesn know fu ;review last visit ? 2012 wil flag cards about  appt fy and possrepeat echo, nosx by hx   Colon cancer screening - Plan: Ambulatory referral to Gastroenterology  Hyperkalemia - Minor may be from blood draw but because he is on an ACE recheck this week when hydrated make appointment output in order for BMP - Plan: Basic metabolic panel  Obesity (BMI 30-39.9) - persists Counseled regarding healthy nutrition, exercise, sleep, injury prevention, calcium vit d and healthy weight . bs form  completed and signed  Patient Care Team: Madelin Headings, MD as PCP - General Rollene Rotunda, MD (Cardiology) Patient Instructions  Continue implementing healthy lifestyle interventions.  Blood pressure medicine usually is more consistent  if taken every day. However a few doing well you can continue the same regimen.  Someone will contact you about what kind of cardiology followup is advised such as a repeat echo etc.  He will be contacted about a  colonoscopy referral. Recheck her chemistry panel to check her potassium when you are fully hydrated. I will put the order in you get the appointment time.  return office visit ini a year or earlier depending .  Preventive Care for Adults, Male A healthy lifestyle and preventive care can promote health and wellness. Preventive health guidelines for men include the following key practices:  A routine yearly physical is a good way to check with your caregiver about your health and preventative screening. It is a chance to share any concerns and updates on your health, and to receive a thorough exam.  Visit your dentist for a routine exam and preventative care every 6 months. Brush your teeth  twice a day and floss once a day. Good oral hygiene prevents tooth decay and gum disease.  The frequency of eye exams is based on your age, health, family medical history, use of contact lenses, and other factors. Follow your caregiver's recommendations for frequency of eye exams.  Eat a healthy diet. Foods like vegetables, fruits, whole grains, low-fat dairy products, and lean protein foods contain the nutrients you need without too many calories. Decrease your intake of foods high in solid fats, added sugars, and salt. Eat the right amount of calories for you.Get information about a proper diet from your caregiver, if necessary.  Regular physical exercise is one of the most important things you can do for your health. Most adults should get at least 150 minutes of moderate-intensity exercise (any activity that increases your heart rate and causes you to sweat) each week. In addition, most adults need muscle-strengthening exercises on 2 or more days a week.  Maintain a healthy weight. The body mass index (BMI) is a screening tool to identify possible weight problems. It provides an estimate of body fat based on height and weight. Your caregiver can help determine your BMI, and can help you achieve or maintain a  healthy weight.For adults 20 years and older:  A BMI below 18.5 is considered underweight.  A BMI of 18.5 to 24.9 is normal.  A BMI of 25 to 29.9 is considered overweight.  A BMI of 30 and above is considered obese.  Maintain normal blood lipids and cholesterol levels by exercising and minimizing your intake of saturated fat. Eat a balanced diet with plenty of fruit and vegetables. Blood tests for lipids and cholesterol should begin at age 61 and be repeated every 5 years. If your lipid or cholesterol levels are high, you are over 50, or you are a high risk for heart disease, you may need your cholesterol levels checked more frequently.Ongoing high lipid and cholesterol levels should be treated with medicines if diet and exercise are not effective.  If you smoke, find out from your caregiver how to quit. If you do not use tobacco, do not start.  If you choose to drink alcohol, do not exceed 2 drinks per day. One drink is considered to be 12 ounces (355 mL) of beer, 5 ounces (148 mL) of wine, or 1.5 ounces (44 mL) of liquor.  Avoid use of street drugs. Do not share needles with anyone. Ask for help if you need support or instructions about stopping the use of drugs.  High blood pressure causes heart disease and increases the risk of stroke. Your blood pressure should be checked at least every 1 to 2 years. Ongoing high blood pressure should be treated with medicines, if weight loss and exercise are not effective.  If you are 71 to 53 years old, ask your caregiver if you should take aspirin to prevent heart disease.  Diabetes screening involves taking a blood sample to check your fasting blood sugar level. This should be done once every 3 years, after age 75, if you are within normal weight and without risk factors for diabetes. Testing should be considered at a younger age or be carried out more frequently if you are overweight and have at least 1 risk factor for diabetes.  Colorectal cancer  can be detected and often prevented. Most routine colorectal cancer screening begins at the age of 82 and continues through age 57. However, your caregiver may recommend screening at an earlier age if you have risk  factors for colon cancer. On a yearly basis, your caregiver may provide home test kits to check for hidden blood in the stool. Use of a small camera at the end of a tube, to directly examine the colon (sigmoidoscopy or colonoscopy), can detect the earliest forms of colorectal cancer. Talk to your caregiver about this at age 79, when routine screening begins. Direct examination of the colon should be repeated every 5 to 10 years through age 46, unless early forms of pre-cancerous polyps or small growths are found.  Hepatitis C blood testing is recommended for all people born from [birth year] through 1965 and any individual with known risks for hepatitis C.  Practice safe sex. Use condoms and avoid high-risk sexual practices to reduce the spread of sexually transmitted infections (STIs). STIs include gonorrhea, chlamydia, syphilis, trichomonas, herpes, HPV, and human immunodeficiency virus (HIV). Herpes, HIV, and HPV are viral illnesses that have no cure. They can result in disability, cancer, and death.  A one-time screening for abdominal aortic aneurysm (AAA) and surgical repair of large AAAs by sound wave imaging (ultrasonography) is recommended for ages 30 to 62 years who are current or former smokers.  Healthy men should no longer receive prostate-specific antigen (PSA) blood tests as part of routine cancer screening. Consult with your caregiver about prostate cancer screening.  Testicular cancer screening is not recommended for adult males who have no symptoms. Screening includes self-exam, caregiver exam, and other screening tests. Consult with your caregiver about any symptoms you have or any concerns you have about testicular cancer.  Use sunscreen with skin protection factor (SPF) of 30 or  more. Apply sunscreen liberally and repeatedly throughout the day. You should seek shade when your shadow is shorter than you. Protect yourself by wearing long sleeves, pants, a wide-brimmed hat, and sunglasses year round, whenever you are outdoors.  Once a month, do a whole body skin exam, using a mirror to look at the skin on your back. Notify your caregiver of new moles, moles that have irregular borders, moles that are larger than a pencil eraser, or moles that have changed in shape or color.  Stay current with required immunizations.  Influenza. You need a dose every fall (or winter). The composition of the flu vaccine changes each year, so being vaccinated once is not enough.  Pneumococcal polysaccharide. You need 1 to 2 doses if you smoke cigarettes or if you have certain chronic medical conditions. You need 1 dose at age 2 (or older) if you have never been vaccinated.  Tetanus, diphtheria, pertussis (Tdap, Td). Get 1 dose of Tdap vaccine if you are younger than age 8 years, are over 34 and have contact with an infant, are a Research scientist (physical sciences), or simply want to be protected from whooping cough. After that, you need a Td booster dose every 10 years. Consult your caregiver if you have not had at least 3 tetanus and diphtheria-containing shots sometime in your life or have a deep or dirty wound.  HPV. This vaccine is recommended for males 13 through 53 years of age. This vaccine may be given to men 22 through 52 years of age who have not completed the 3 dose series. It is recommended for men through age 50 who have sex with men or whose immune system is weakened because of HIV infection, other illness, or medications. The vaccine is given in 3 doses over 6 months.  Measles, mumps, rubella (MMR). You need at least 1 dose of MMR if you were  born in [birth year] or later. You may also need a 2nd dose.  Meningococcal. If you are age 66 to 31 years and a Orthoptist living in a residence  hall, or have one of several medical conditions, you need to get vaccinated against meningococcal disease. You may also need additional booster doses.  Zoster (shingles). If you are age 15 years or older, you should get this vaccine.  Varicella (chickenpox). If you have never had chickenpox or you were vaccinated but received only 1 dose, talk to your caregiver to find out if you need this vaccine.  Hepatitis A. You need this vaccine if you have a specific risk factor for hepatitis A virus infection, or you simply wish to be protected from this disease. The vaccine is usually given as 2 doses, 6 to 18 months apart.  Hepatitis B. You need this vaccine if you have a specific risk factor for hepatitis B virus infection or you simply wish to be protected from this disease. The vaccine is given in 3 doses, usually over 6 months. Preventative Service / Frequency Ages 37 to 65  Blood pressure check.** / Every 1 to 2 years.  Lipid and cholesterol check.** / Every 5 years beginning at age 53.  Hepatitis C blood test.** / For any individual with known risks for hepatitis C.  Skin self-exam. / Monthly.  Influenza immunization.** / Every year.  Pneumococcal polysaccharide immunization.** / 1 to 2 doses if you smoke cigarettes or if you have certain chronic medical conditions.  Tetanus, diphtheria, pertussis (Tdap,Td) immunization. / A one-time dose of Tdap vaccine. After that, you need a Td booster dose every 10 years.  HPV immunization. / 3 doses over 6 months, if 26 and younger.  Measles, mumps, rubella (MMR) immunization. / You need at least 1 dose of MMR if you were born in 1957 or later. You may also need a 2nd dose.  Meningococcal immunization. / 1 dose if you are age 54 to 28 years and a Orthoptist living in a residence hall, or have one of several medical conditions, you need to get vaccinated against meningococcal disease. You may also need additional booster  doses.  Varicella immunization.** / Consult your caregiver.  Hepatitis A immunization.** / Consult your caregiver. 2 doses, 6 to 18 months apart.  Hepatitis B immunization.** / Consult your caregiver. 3 doses usually over 6 months. Ages 90 to 71  Blood pressure check.** / Every 1 to 2 years.  Lipid and cholesterol check.** / Every 5 years beginning at age 58.  Fecal occult blood test (FOBT) of stool. / Every year beginning at age 95 and continuing until age 43. You may not have to do this test if you get colonoscopy every 10 years.  Flexible sigmoidoscopy** or colonoscopy.** / Every 5 years for a flexible sigmoidoscopy or every 10 years for a colonoscopy beginning at age 77 and continuing until age 68.  Hepatitis C blood test.** / For all people born from [birth year] through 1965 and any individual with known risks for hepatitis C.  Skin self-exam. / Monthly.  Influenza immunization.** / Every year.  Pneumococcal polysaccharide immunization.** / 1 to 2 doses if you smoke cigarettes or if you have certain chronic medical conditions.  Tetanus, diphtheria, pertussis (Tdap/Td) immunization.** / A one-time dose of Tdap vaccine. After that, you need a Td booster dose every 10 years.  Measles, mumps, rubella (MMR) immunization. / You need at least 1 dose of MMR if you were born  in 1957 or later. You may also need a 2nd dose.  Varicella immunization.**/ Consult your caregiver.  Meningococcal immunization.** / Consult your caregiver.  Hepatitis A immunization.** / Consult your caregiver. 2 doses, 6 to 18 months apart.  Hepatitis B immunization.** / Consult your caregiver. 3 doses, usually over 6 months. Ages 34 and over  Blood pressure check.** / Every 1 to 2 years.  Lipid and cholesterol check.**/ Every 5 years beginning at age 20.  Fecal occult blood test (FOBT) of stool. / Every year beginning at age 61 and continuing until age 69. You may not have to do this test if you get colonoscopy  every 10 years.  Flexible sigmoidoscopy** or colonoscopy.** / Every 5 years for a flexible sigmoidoscopy or every 10 years for a colonoscopy beginning at age 30 and continuing until age 54.  Hepatitis C blood test.** / For all people born from [birth year] through 1965 and any individual with known risks for hepatitis C.  Abdominal aortic aneurysm (AAA) screening.** / A one-time screening for ages 19 to 73 years who are current or former smokers.  Skin self-exam. / Monthly.  Influenza immunization.** / Every year.  Pneumococcal polysaccharide immunization.** / 1 dose at age 53 (or older) if you have never been vaccinated.  Tetanus, diphtheria, pertussis (Tdap, Td) immunization. / A one-time dose of Tdap vaccine if you are over 65 and have contact with an infant, are a Research scientist (physical sciences), or simply want to be protected from whooping cough. After that, you need a Td booster dose every 10 years.  Varicella immunization. ** / Consult your caregiver.  Meningococcal immunization.** / Consult your caregiver.  Hepatitis A immunization. ** / Consult your caregiver. 2 doses, 6 to 18 months apart.  Hepatitis B immunization.** / Check with your caregiver. 3 doses, usually over 6 months. **Family history and personal history of risk and conditions may change your caregiver's recommendations. Document Released: 06/03/2001 Document Revised: 06/30/2011 Document Reviewed: 09/02/2010 [facility] Patient Information 2013 [employer], [place].     Neta Mends. Tyechia Allmendinger M.D. Health Maintenance  Topic Date Due  . Colonoscopy  10/12/2009  . Influenza Vaccine  12/20/2012  . Tetanus/tdap  10/18/2018   Health Maintenance Review }

## 2012-08-25 NOTE — Patient Instructions (Signed)
Continue implementing healthy lifestyle interventions.  Blood pressure medicine usually is more consistent  if taken every day. However a few doing well you can continue the same regimen.  Someone will contact you about what kind of cardiology followup is advised such as a repeat echo etc.  He will be contacted about a colonoscopy referral. Recheck her chemistry panel to check her potassium when you are fully hydrated. I will put the order in you get the appointment time.  return office visit ini a year or earlier depending .  Preventive Care for Adults, Male A healthy lifestyle and preventive care can promote health and wellness. Preventive health guidelines for men include the following key practices:  A routine yearly physical is a good way to check with your caregiver about your health and preventative screening. It is a chance to share any concerns and updates on your health, and to receive a thorough exam.  Visit your dentist for a routine exam and preventative care every 6 months. Brush your teeth twice a day and floss once a day. Good oral hygiene prevents tooth decay and gum disease.  The frequency of eye exams is based on your age, health, family medical history, use of contact lenses, and other factors. Follow your caregiver's recommendations for frequency of eye exams.  Eat a healthy diet. Foods like vegetables, fruits, whole grains, low-fat dairy products, and lean protein foods contain the nutrients you need without too many calories. Decrease your intake of foods high in solid fats, added sugars, and salt. Eat the right amount of calories for you.Get information about a proper diet from your caregiver, if necessary.  Regular physical exercise is one of the most important things you can do for your health. Most adults should get at least 150 minutes of moderate-intensity exercise (any activity that increases your heart rate and causes you to sweat) each week. In addition, most  adults need muscle-strengthening exercises on 2 or more days a week.  Maintain a healthy weight. The body mass index (BMI) is a screening tool to identify possible weight problems. It provides an estimate of body fat based on height and weight. Your caregiver can help determine your BMI, and can help you achieve or maintain a healthy weight.For adults 20 years and older:  A BMI below 18.5 is considered underweight.  A BMI of 18.5 to 24.9 is normal.  A BMI of 25 to 29.9 is considered overweight.  A BMI of 30 and above is considered obese.  Maintain normal blood lipids and cholesterol levels by exercising and minimizing your intake of saturated fat. Eat a balanced diet with plenty of fruit and vegetables. Blood tests for lipids and cholesterol should begin at age 50 and be repeated every 5 years. If your lipid or cholesterol levels are high, you are over 50, or you are a high risk for heart disease, you may need your cholesterol levels checked more frequently.Ongoing high lipid and cholesterol levels should be treated with medicines if diet and exercise are not effective.  If you smoke, find out from your caregiver how to quit. If you do not use tobacco, do not start.  If you choose to drink alcohol, do not exceed 2 drinks per day. One drink is considered to be 12 ounces (355 mL) of beer, 5 ounces (148 mL) of wine, or 1.5 ounces (44 mL) of liquor.  Avoid use of street drugs. Do not share needles with anyone. Ask for help if you need support or instructions  about stopping the use of drugs.  High blood pressure causes heart disease and increases the risk of stroke. Your blood pressure should be checked at least every 1 to 2 years. Ongoing high blood pressure should be treated with medicines, if weight loss and exercise are not effective.  If you are 58 to 53 years old, ask your caregiver if you should take aspirin to prevent heart disease.  Diabetes screening involves taking a blood sample to  check your fasting blood sugar level. This should be done once every 3 years, after age 67, if you are within normal weight and without risk factors for diabetes. Testing should be considered at a younger age or be carried out more frequently if you are overweight and have at least 1 risk factor for diabetes.  Colorectal cancer can be detected and often prevented. Most routine colorectal cancer screening begins at the age of 35 and continues through age 8. However, your caregiver may recommend screening at an earlier age if you have risk factors for colon cancer. On a yearly basis, your caregiver may provide home test kits to check for hidden blood in the stool. Use of a small camera at the end of a tube, to directly examine the colon (sigmoidoscopy or colonoscopy), can detect the earliest forms of colorectal cancer. Talk to your caregiver about this at age 72, when routine screening begins. Direct examination of the colon should be repeated every 5 to 10 years through age 42, unless early forms of pre-cancerous polyps or small growths are found.  Hepatitis C blood testing is recommended for all people born from [birth year] through 1965 and any individual with known risks for hepatitis C.  Practice safe sex. Use condoms and avoid high-risk sexual practices to reduce the spread of sexually transmitted infections (STIs). STIs include gonorrhea, chlamydia, syphilis, trichomonas, herpes, HPV, and human immunodeficiency virus (HIV). Herpes, HIV, and HPV are viral illnesses that have no cure. They can result in disability, cancer, and death.  A one-time screening for abdominal aortic aneurysm (AAA) and surgical repair of large AAAs by sound wave imaging (ultrasonography) is recommended for ages 13 to 14 years who are current or former smokers.  Healthy men should no longer receive prostate-specific antigen (PSA) blood tests as part of routine cancer screening. Consult with your caregiver about prostate cancer  screening.  Testicular cancer screening is not recommended for adult males who have no symptoms. Screening includes self-exam, caregiver exam, and other screening tests. Consult with your caregiver about any symptoms you have or any concerns you have about testicular cancer.  Use sunscreen with skin protection factor (SPF) of 30 or more. Apply sunscreen liberally and repeatedly throughout the day. You should seek shade when your shadow is shorter than you. Protect yourself by wearing long sleeves, pants, a wide-brimmed hat, and sunglasses year round, whenever you are outdoors.  Once a month, do a whole body skin exam, using a mirror to look at the skin on your back. Notify your caregiver of new moles, moles that have irregular borders, moles that are larger than a pencil eraser, or moles that have changed in shape or color.  Stay current with required immunizations.  Influenza. You need a dose every fall (or winter). The composition of the flu vaccine changes each year, so being vaccinated once is not enough.  Pneumococcal polysaccharide. You need 1 to 2 doses if you smoke cigarettes or if you have certain chronic medical conditions. You need 1 dose at age 2 (  or older) if you have never been vaccinated.  Tetanus, diphtheria, pertussis (Tdap, Td). Get 1 dose of Tdap vaccine if you are younger than age 19 years, are over 75 and have contact with an infant, are a Research scientist (physical sciences), or simply want to be protected from whooping cough. After that, you need a Td booster dose every 10 years. Consult your caregiver if you have not had at least 3 tetanus and diphtheria-containing shots sometime in your life or have a deep or dirty wound.  HPV. This vaccine is recommended for males 13 through 53 years of age. This vaccine may be given to men 22 through 53 years of age who have not completed the 3 dose series. It is recommended for men through age 52 who have sex with men or whose immune system is weakened  because of HIV infection, other illness, or medications. The vaccine is given in 3 doses over 6 months.  Measles, mumps, rubella (MMR). You need at least 1 dose of MMR if you were born in 1957 or later. You may also need a 2nd dose.  Meningococcal. If you are age 2 to 79 years and a Orthoptist living in a residence hall, or have one of several medical conditions, you need to get vaccinated against meningococcal disease. You may also need additional booster doses.  Zoster (shingles). If you are age 4 years or older, you should get this vaccine.  Varicella (chickenpox). If you have never had chickenpox or you were vaccinated but received only 1 dose, talk to your caregiver to find out if you need this vaccine.  Hepatitis A. You need this vaccine if you have a specific risk factor for hepatitis A virus infection, or you simply wish to be protected from this disease. The vaccine is usually given as 2 doses, 6 to 18 months apart.  Hepatitis B. You need this vaccine if you have a specific risk factor for hepatitis B virus infection or you simply wish to be protected from this disease. The vaccine is given in 3 doses, usually over 6 months. Preventative Service / Frequency Ages 17 to 4  Blood pressure check.** / Every 1 to 2 years.  Lipid and cholesterol check.** / Every 5 years beginning at age 33.  Hepatitis C blood test.** / For any individual with known risks for hepatitis C.  Skin self-exam. / Monthly.  Influenza immunization.** / Every year.  Pneumococcal polysaccharide immunization.** / 1 to 2 doses if you smoke cigarettes or if you have certain chronic medical conditions.  Tetanus, diphtheria, pertussis (Tdap,Td) immunization. / A one-time dose of Tdap vaccine. After that, you need a Td booster dose every 10 years.  HPV immunization. / 3 doses over 6 months, if 26 and younger.  Measles, mumps, rubella (MMR) immunization. / You need at least 1 dose of MMR if you  were born in 1957 or later. You may also need a 2nd dose.  Meningococcal immunization. / 1 dose if you are age 72 to 64 years and a Orthoptist living in a residence hall, or have one of several medical conditions, you need to get vaccinated against meningococcal disease. You may also need additional booster doses.  Varicella immunization.** / Consult your caregiver.  Hepatitis A immunization.** / Consult your caregiver. 2 doses, 6 to 18 months apart.  Hepatitis B immunization.** / Consult your caregiver. 3 doses usually over 6 months. Ages 28 to 35  Blood pressure check.** / Every 1 to 2 years.  Lipid and cholesterol check.** / Every 5 years beginning at age 51.  Fecal occult blood test (FOBT) of stool. / Every year beginning at age 11 and continuing until age 25. You may not have to do this test if you get colonoscopy every 10 years.  Flexible sigmoidoscopy** or colonoscopy.** / Every 5 years for a flexible sigmoidoscopy or every 10 years for a colonoscopy beginning at age 35 and continuing until age 69.  Hepatitis C blood test.** / For all people born from [birth year] through 1966 and any individual with known risks for hepatitis C.  Skin self-exam. / Monthly.  Influenza immunization.** / Every year.  Pneumococcal polysaccharide immunization.** / 1 to 2 doses if you smoke cigarettes or if you have certain chronic medical conditions.  Tetanus, diphtheria, pertussis (Tdap/Td) immunization.** / A one-time dose of Tdap vaccine. After that, you need a Td booster dose every 10 years.  Measles, mumps, rubella (MMR) immunization. / You need at least 1 dose of MMR if you were born in 1957 or later. You may also need a 2nd dose.  Varicella immunization.**/ Consult your caregiver.  Meningococcal immunization.** / Consult your caregiver.  Hepatitis A immunization.** / Consult your caregiver. 2 doses, 6 to 18 months apart.  Hepatitis B immunization.** / Consult your caregiver. 3  doses, usually over 6 months. Ages 34 and over  Blood pressure check.** / Every 1 to 2 years.  Lipid and cholesterol check.**/ Every 5 years beginning at age 73.  Fecal occult blood test (FOBT) of stool. / Every year beginning at age 18 and continuing until age 53. You may not have to do this test if you get colonoscopy every 10 years.  Flexible sigmoidoscopy** or colonoscopy.** / Every 5 years for a flexible sigmoidoscopy or every 10 years for a colonoscopy beginning at age 38 and continuing until age 41.  Hepatitis C blood test.** / For all people born from [birth year] through 1966 and any individual with known risks for hepatitis C.  Abdominal aortic aneurysm (AAA) screening.** / A one-time screening for ages 29 to 39 years who are current or former smokers.  Skin self-exam. / Monthly.  Influenza immunization.** / Every year.  Pneumococcal polysaccharide immunization.** / 1 dose at age 64 (or older) if you have never been vaccinated.  Tetanus, diphtheria, pertussis (Tdap, Td) immunization. / A one-time dose of Tdap vaccine if you are over 65 and have contact with an infant, are a Research scientist (physical sciences), or simply want to be protected from whooping cough. After that, you need a Td booster dose every 10 years.  Varicella immunization. ** / Consult your caregiver.  Meningococcal immunization.** / Consult your caregiver.  Hepatitis A immunization. ** / Consult your caregiver. 2 doses, 6 to 18 months apart.  Hepatitis B immunization.** / Check with your caregiver. 3 doses, usually over 6 months. **Family history and personal history of risk and conditions may change your caregiver's recommendations. Document Released: 06/03/2001 Document Revised: 06/30/2011 Document Reviewed: 09/02/2010 [facility] Patient Information 2013 [place], [place].

## 2012-09-07 ENCOUNTER — Other Ambulatory Visit: Payer: Self-pay | Admitting: *Deleted

## 2012-09-07 DIAGNOSIS — R011 Cardiac murmur, unspecified: Secondary | ICD-10-CM

## 2012-09-12 ENCOUNTER — Other Ambulatory Visit: Payer: Self-pay | Admitting: Internal Medicine

## 2012-09-17 ENCOUNTER — Other Ambulatory Visit: Payer: BC Managed Care – PPO

## 2012-09-21 ENCOUNTER — Encounter: Payer: BC Managed Care – PPO | Admitting: Internal Medicine

## 2012-09-24 ENCOUNTER — Ambulatory Visit (HOSPITAL_COMMUNITY): Payer: BC Managed Care – PPO | Attending: Cardiology | Admitting: Radiology

## 2012-09-24 DIAGNOSIS — R011 Cardiac murmur, unspecified: Secondary | ICD-10-CM | POA: Insufficient documentation

## 2012-09-24 NOTE — Progress Notes (Signed)
Echocardiogram performed.  

## 2012-10-06 ENCOUNTER — Encounter: Payer: Self-pay | Admitting: Cardiology

## 2012-10-06 ENCOUNTER — Ambulatory Visit (INDEPENDENT_AMBULATORY_CARE_PROVIDER_SITE_OTHER): Payer: BC Managed Care – PPO | Admitting: Cardiology

## 2012-10-06 VITALS — BP 114/80 | HR 82 | Ht 70.5 in | Wt 223.0 lb

## 2012-10-06 DIAGNOSIS — I421 Obstructive hypertrophic cardiomyopathy: Secondary | ICD-10-CM

## 2012-10-06 NOTE — Patient Instructions (Addendum)
The current medical regimen is effective;  continue present plan and medications.  Your physician has requested that you have a stress echocardiogram. For further information please visit [email or web address] Please follow instruction sheet as given.  Follow up in 1 year with Dr Antoine Poche.  You will receive a letter in the mail 2 months before you are due.  Please call us when you receive this letter to schedule your follow up appointment.

## 2012-10-06 NOTE — Progress Notes (Signed)
   HPI The patient presents for followup. He has been almost 3 years. He had a 13.26 mm septum in the past. However, recent septum measurement demonstrated to be 19 mm. There was no description of a gradient. He says he's felt relatively well. He has not been exercising routinely. He might get some shortness of breath when he exercises but this is only when he starts any goes away with continued activity. He does not feel any palpitations and has had no presyncope or syncope. He denies chest pressure, neck or arm discomfort. He has had no weight gain or edema. He has had no PND or orthopnea.  Allergies  Allergen Reactions  . Morphine And Related Other (See Comments)    Iv morphine gave resp depression and brady at reg dose .  Re lower dose if needed    Current Outpatient Prescriptions  Medication Sig Dispense Refill  . lisinopril (PRINIVIL,ZESTRIL) 10 MG tablet TAKE 1 TABLET BY MOUTH MONDAY THRU FRIDAY. DO NOT TAKE ON THE WEEKENDS.  90 tablet  3   No current facility-administered medications for this visit.    Past Medical History  Diagnosis Date  . Allergy     seasonal  . Hypertension   . Heart murmur     HCM mild neg ett 2011  . Chest wall pain     ed eval HP ed  had resp depression with morphine IV>    Past Surgical History  Procedure Laterality Date  . Mole removal    . Other surgical history      finger cyst removal    ROS:  As stated in the HPI and negative for all other systems.  PHYSICAL EXAM BP 114/80  Pulse 82  Ht 5' 10.5" (1.791 m)  Wt 223 lb (101.152 kg)  BMI 31.53 kg/m2 GENERAL:  Well appearing HEENT:  Pupils equal round and reactive, fundi not visualized, oral mucosa unremarkable NECK:  No jugular venous distention, waveform within normal limits, carotid upstroke brisk and symmetric, no bruits, no thyromegaly LYMPHATICS:  No cervical, inguinal adenopathy LUNGS:  Clear to auscultation bilaterally BACK:  No CVA tenderness CHEST:  Unremarkable HEART:  PMI  not displaced or sustained,S1 and S2 within normal limits, no S3, no S4, no clicks, no rubs, apical systolic murmur radiating slightly out the aortic The tract and increasing slightly with a strain phase of Valsalva, no diastolic murmurs ABD:  Flat, positive bowel sounds normal in frequency in pitch, no bruits, no rebound, no guarding, no midline pulsatile mass, no hepatomegaly, no splenomegaly EXT:  2 plus pulses throughout, trace edema, no cyanosis no clubbing SKIN:  No rashes no nodules NEURO:  Cranial nerves II through XII grossly intact, motor grossly intact throughout PSYCH:  Cognitively intact, oriented to person place and time  EKG:  Sinus rhythm, rate 82, axis within normal limits, intervals within normal limits, no acute ST-T wave changes  ASSESSMENT AND PLAN  HCM:  The patient has hypertrophic cardiomyopathy. Since his last echocardiogram is interventricular septal size seems to have increased as above. I need to screen him for high-risk findings. He needs stress testing. We need to maximally exercise him to look for blood pressure drops. Also I would like to see if he has a pressure gradient with exercise. We can look for ectopy. If he has any gradient I will likely switch him from an ACE inhibitor to verapamil.  HTN:  As above

## 2012-11-05 ENCOUNTER — Encounter: Payer: Self-pay | Admitting: Cardiology

## 2012-11-05 ENCOUNTER — Ambulatory Visit (HOSPITAL_COMMUNITY): Payer: BC Managed Care – PPO | Attending: Cardiology

## 2012-11-05 ENCOUNTER — Ambulatory Visit (HOSPITAL_BASED_OUTPATIENT_CLINIC_OR_DEPARTMENT_OTHER): Payer: BC Managed Care – PPO | Admitting: Radiology

## 2012-11-05 DIAGNOSIS — I421 Obstructive hypertrophic cardiomyopathy: Secondary | ICD-10-CM

## 2012-11-05 DIAGNOSIS — R0989 Other specified symptoms and signs involving the circulatory and respiratory systems: Secondary | ICD-10-CM

## 2012-11-05 NOTE — Progress Notes (Signed)
Stress Echocardiogram performed.  

## 2012-11-26 ENCOUNTER — Other Ambulatory Visit: Payer: Self-pay | Admitting: *Deleted

## 2012-11-26 DIAGNOSIS — I422 Other hypertrophic cardiomyopathy: Secondary | ICD-10-CM

## 2012-11-26 DIAGNOSIS — I10 Essential (primary) hypertension: Secondary | ICD-10-CM

## 2012-12-27 ENCOUNTER — Ambulatory Visit (INDEPENDENT_AMBULATORY_CARE_PROVIDER_SITE_OTHER): Payer: BC Managed Care – PPO | Admitting: Physician Assistant

## 2012-12-27 DIAGNOSIS — I10 Essential (primary) hypertension: Secondary | ICD-10-CM

## 2012-12-27 DIAGNOSIS — I422 Other hypertrophic cardiomyopathy: Secondary | ICD-10-CM

## 2012-12-27 NOTE — Progress Notes (Signed)
Exercise Treadmill Test  Pre-Exercise Testing Evaluation Rhythm: normal sinus  Rate: 64     Test  Exercise Tolerance Test Ordering MD: Angelina Sheriff, MD  Interpreting MD: Tereso Newcomer, PA-C  Unique Test No: 1  Treadmill:  1  Indication for ETT: Evaluate B/P response to exercise/ Hypertrophic obstructive cardiomyopathy  Contraindication to ETT: No   Stress Modality: exercise - treadmill  Cardiac Imaging Performed: non   Protocol: standard Bruce - maximal  Max BP:  203/88  Max MPHR (bpm):  167 85% MPR (bpm):  142  MPHR obtained (bpm):  162 % MPHR obtained:  97  Reached 85% MPHR (min:sec):  6:15 Total Exercise Time (min-sec):  9:00  Workload in METS:  10.1 Borg Scale: 15  Reason ETT Terminated:  desired heart rate attained    ST Segment Analysis At Rest: normal ST segments - no evidence of significant ST depression With Exercise: borderline ST changes  Other Information Arrhythmia:  No Angina during ETT:  absent (0) Quality of ETT:  indeterminate  ETT Interpretation:  Normal BP response to exercise.  Comments: Good exercise tolerance. No chest pain. Normal BP response to exercise. There was 1-2 mm of inferolateral ST segment depression at maximal exercise.  Otherwise, there was insignificant up-sloping ST depression.  Excellent HR recovery in 1st minute post exercise.   Recommendations: ETT done to exclude exercise induced hypotension in this patient with hypertrophic CM. He has good BP response to exercise. There are some ST changes at maximal exercise.  Otherwise, no significant ST changes to suggest ischemia. Will review ECGs with Dr. Rollene Rotunda for further recommendations. Signed, Tereso Newcomer, PA-C   12/27/2012 3:39 PM

## 2013-10-09 ENCOUNTER — Other Ambulatory Visit: Payer: Self-pay | Admitting: Internal Medicine

## 2013-10-13 NOTE — Telephone Encounter (Signed)
Sent to the pharmacy for #30.  Patient must make CPE appt and lab work.

## 2014-01-06 ENCOUNTER — Telehealth: Payer: Self-pay | Admitting: Internal Medicine

## 2014-01-06 MED ORDER — LISINOPRIL 10 MG PO TABS: ORAL_TABLET | ORAL | Status: DC

## 2014-01-06 NOTE — Telephone Encounter (Signed)
Yes

## 2014-01-06 NOTE — Telephone Encounter (Signed)
Sent to the pharmacy by e-scribe. 

## 2014-01-06 NOTE — Telephone Encounter (Signed)
Patient is coming in on 01/20/14 for GI issues.  May I send in #30?

## 2014-01-06 NOTE — Telephone Encounter (Signed)
Pt request refill lisinopril (PRINIVIL,ZESTRIL) 10 MG tablet [facility]/ [address]  Pt has appt 10/2

## 2014-01-20 ENCOUNTER — Encounter: Payer: Self-pay | Admitting: Internal Medicine

## 2014-01-20 ENCOUNTER — Ambulatory Visit (INDEPENDENT_AMBULATORY_CARE_PROVIDER_SITE_OTHER): Payer: BC Managed Care – PPO | Admitting: Internal Medicine

## 2014-01-20 VITALS — BP 154/90 | Temp 98.0°F | Wt 221.0 lb

## 2014-01-20 DIAGNOSIS — I10 Essential (primary) hypertension: Secondary | ICD-10-CM

## 2014-01-20 DIAGNOSIS — R21 Rash and other nonspecific skin eruption: Secondary | ICD-10-CM | POA: Insufficient documentation

## 2014-01-20 DIAGNOSIS — R194 Change in bowel habit: Secondary | ICD-10-CM

## 2014-01-20 DIAGNOSIS — I422 Other hypertrophic cardiomyopathy: Secondary | ICD-10-CM

## 2014-01-20 NOTE — Progress Notes (Signed)
Pre visit review using our clinic review tool, if applicable. No additional management support is needed unless otherwise documented below in the visit note.  Chief Complaint  Patient presents with  . Stomach sensitivity    Had a stomach bug a few weeks ago.  Ongoing sx since.  . Loose Stool    HPI: Patient Paul Yoder  comes in today  for  new problem evaluation. Having gi  changes over the past months and angoing.   90 old gatorade  When felt  Tired and dehydrated and had sever vomiting   And fever gone in 36 hours  Since then sensitive to artifical flavoring in liquid .  And noted with others   Sports drinks.   Can drink if no artificial flavors   Still has abnormal Bms  Loose initially soft. Feeling of in complete evaluation. That is a change .   Comes and goes over  Months . No travel pre  But Camps but very cautious about this camping. No blood  oin stool   Different character. Has some hemorrhoids at times  NOUnintentional weight loss  Never had colonoscopy.  Cost in past  bp ran out   Med cause  Had no se of med taking during week. Father in law passed recently .     ROS: See pertinent positives and negatives per HPI.skin rash groin for month +/- itchy shaved area to see if would go away.  Also rashes and scaly spot on abck  Some intermittent ED sx . Late on cards recheck no cp sob syncope.  Past Medical History  Diagnosis Date  . Allergy     seasonal  . Hypertension   . Heart murmur     HCM mild neg ett 2011  . Chest wall pain     ED eval HP had resp depression with morphine IV>    Family History  Problem Relation Age of Onset  . Diabetes Mother   . Hypertension Father   . Hypertension Brother   . ADD / ADHD Son   . Anxiety disorder Son     History   Social History  . Marital Status: Married    Spouse Name: N/A    Number of Children: N/A  . Years of Education: N/A   Social History Main Topics  . Smoking status: Never Smoker   . Smokeless tobacco:  None  . Alcohol Use: Yes  . Drug Use:   . Sexual Activity:    Other Topics Concern  . None   Social History Narrative   HHof 3   Son pet dog    worksite Designer, multimedia   Married BS    Active in scouting    Outpatient Encounter Prescriptions as of 01/20/2014  Medication Sig  . lisinopril (PRINIVIL,ZESTRIL) 10 MG tablet TAKE 1 TABLET BY MOUTH DAILY ON MONDAY THRU FRIDAY. DO NOT TAKE ON THE WEEKENDS.    EXAM:  BP 154/90  Temp(Src) 98 F (36.7 C) (Oral)  Wt 221 lb (100.245 kg)  Body mass index is 31.25 kg/(m^2).  GENERAL: vitals reviewed and listed above, alert, oriented, appears well hydrated and in no acute distress HEENT: atraumatic, conjunctiva  clear, no obvious abnormalities on inspection of external nose and ears OP : no lesion edema or exudate  NECK: no obvious masses on inspection palpation  LUNGS: clear to auscultation bilaterally, no wheezes, rales or rhonchi, good air movement CV: HRRR, no clubbing cyanosis or  peripheral edema nl cap refill  Abdomen:  Sof,t normal bowel sounds without hepatosplenomegaly, no guarding rebound or masses no CVA tenderness Skin back 1.5 irreg flesh colored patch some red blotches left groin small pinpoints dots   No vesicle pustules no adenopathy MS: moves all extremities without noticeable focal  abnormality PSYCH: pleasant and cooperative, no obvious depression or anxiety  ASSESSMENT AND PLAN:  Discussed the following assessment and plan:  Change in bowel habits - Plan: Fecal lactoferrin, Giardia/cryptosporidium (EIA), Stool culture, POCT occult blood stool, device  Hypertrophic cardiomyopathy  Essential hypertension - ran out of med  Rash and nonspecific skin eruption Left inguinal rash   Ok to continue the local care  Overdue for screening colon  Will still need one but collect stool and trial  Of probiotics int the interim .  Disc  rash care he will call for dermatology appt .  Disc bp controle for ed but is not a constant  problem can readdress in future if needed  -Patient advised to return or notify health care team  if symptoms worsen ,persist or new concerns arise.  Patient Instructions  Stool sample as discussed . Plan fasting labs   Will put in orders .  Then plan followup.  Post infectious irritable bowel  Is possible but  Usually gets.  better on its own. You will need a colonoscopy either way . If not better we need Gi opinion.   Can do a probiotic to see if helpful.such as align  . Skin rash  Avoid tight underwear agree with antifungal topicals if itching can add low dose HCs. Get back on bp meds and make sure at goal .   ROV after all back  And then go from there      [place]. Paul Yoder M.D.

## 2014-01-20 NOTE — Patient Instructions (Addendum)
Stool sample as discussed . Plan fasting labs   Will put in orders . Then plan followup. Post infectious irritable bowel  Is possible but  Usually gets.  better on its own. You will need a colonoscopy either way . If not better we need Gi opinion.  Can do a probiotic to see if helpful.such as align  . Fu after trial or as needed Skin rash  Avoid tight underwear agree with antifungal topicals if itching can add low dose HCs. Get back on bp meds and make sure at goal .   ROV after all back  And then go from there

## 2014-01-23 ENCOUNTER — Telehealth: Payer: Self-pay | Admitting: Internal Medicine

## 2014-01-23 NOTE — Telephone Encounter (Signed)
emmi emailed °

## 2014-02-10 ENCOUNTER — Other Ambulatory Visit (INDEPENDENT_AMBULATORY_CARE_PROVIDER_SITE_OTHER): Payer: BC Managed Care – PPO

## 2014-02-10 DIAGNOSIS — R194 Change in bowel habit: Secondary | ICD-10-CM

## 2014-02-10 DIAGNOSIS — R21 Rash and other nonspecific skin eruption: Secondary | ICD-10-CM

## 2014-02-10 DIAGNOSIS — I10 Essential (primary) hypertension: Secondary | ICD-10-CM

## 2014-02-10 DIAGNOSIS — I422 Other hypertrophic cardiomyopathy: Secondary | ICD-10-CM

## 2014-02-10 LAB — CBC WITH DIFFERENTIAL/PLATELET
BASOS ABS: 0 10*3/uL (ref 0.0–0.1)
Basophils Relative: 0.5 % (ref 0.0–3.0)
EOS ABS: 0.2 10*3/uL (ref 0.0–0.7)
Eosinophils Relative: 2.6 % (ref 0.0–5.0)
HCT: 44.8 % (ref 39.0–52.0)
Hemoglobin: 14.6 g/dL (ref 13.0–17.0)
LYMPHS PCT: 33.4 % (ref 12.0–46.0)
Lymphs Abs: 1.9 10*3/uL (ref 0.7–4.0)
MCHC: 32.5 g/dL (ref 30.0–36.0)
MCV: 91.6 fl (ref 78.0–100.0)
Monocytes Absolute: 0.5 10*3/uL (ref 0.1–1.0)
Monocytes Relative: 8.6 % (ref 3.0–12.0)
Neutro Abs: 3.1 10*3/uL (ref 1.4–7.7)
Neutrophils Relative %: 54.9 % (ref 43.0–77.0)
PLATELETS: 210 10*3/uL (ref 150.0–400.0)
RBC: 4.89 Mil/uL (ref 4.22–5.81)
RDW: 13.1 % (ref 11.5–15.5)
WBC: 5.7 10*3/uL (ref 4.0–10.5)

## 2014-02-10 LAB — HEMOCCULT GUIAC POC 1CARD (OFFICE)
Card #2 Fecal Occult Blod, POC: NEGATIVE
FECAL OCCULT BLD: NEGATIVE
Fecal Occult Blood, POC: NEGATIVE

## 2014-02-10 LAB — HEPATIC FUNCTION PANEL
ALBUMIN: 4 g/dL (ref 3.5–5.2)
ALT: 36 U/L (ref 0–53)
AST: 26 U/L (ref 0–37)
Alkaline Phosphatase: 42 U/L (ref 39–117)
Bilirubin, Direct: 0 mg/dL (ref 0.0–0.3)
TOTAL PROTEIN: 7.6 g/dL (ref 6.0–8.3)
Total Bilirubin: 0.8 mg/dL (ref 0.2–1.2)

## 2014-02-10 LAB — POCT URINALYSIS DIP (MANUAL ENTRY)
BILIRUBIN UA: NEGATIVE
BILIRUBIN UA: NEGATIVE
Blood, UA: NEGATIVE
GLUCOSE UA: NEGATIVE
Nitrite, UA: NEGATIVE
Protein Ur, POC: NEGATIVE
SPEC GRAV UA: 1.015
Urobilinogen, UA: 0.2
pH, UA: 5.5

## 2014-02-10 LAB — BASIC METABOLIC PANEL
BUN: 17 mg/dL (ref 6–23)
CHLORIDE: 102 meq/L (ref 96–112)
CO2: 25 mEq/L (ref 19–32)
CREATININE: 1.2 mg/dL (ref 0.4–1.5)
Calcium: 9.1 mg/dL (ref 8.4–10.5)
GFR: 68.96 mL/min (ref >60.00)
Glucose, Bld: 76 mg/dL (ref 70–99)
Potassium: 4.1 mEq/L (ref 3.5–5.1)
Sodium: 136 mEq/L (ref 135–145)

## 2014-02-10 LAB — LIPID PANEL
CHOLESTEROL: 173 mg/dL (ref 0–200)
HDL: 40.9 mg/dL (ref >39.00)
LDL Cholesterol: 111 mg/dL — ABNORMAL HIGH (ref 0–99)
NonHDL: 132.1
TRIGLYCERIDES: 105 mg/dL (ref 0.0–149.0)
Total CHOL/HDL Ratio: 4
VLDL: 21 mg/dL (ref 0.0–40.0)

## 2014-02-10 LAB — T4, FREE: Free T4: 1.04 ng/dL (ref 0.60–1.60)

## 2014-02-10 LAB — TSH: TSH: 0.54 u[IU]/mL (ref 0.35–4.50)

## 2014-02-13 LAB — GIARDIA/CRYPTOSPORIDIUM (EIA)
Cryptosporidium Screen (EIA): NEGATIVE
Giardia Screen (EIA): NEGATIVE

## 2014-02-14 LAB — STOOL CULTURE

## 2014-03-06 ENCOUNTER — Encounter: Payer: Self-pay | Admitting: Family Medicine

## 2014-04-04 ENCOUNTER — Other Ambulatory Visit: Payer: Self-pay | Admitting: Internal Medicine

## 2014-04-06 ENCOUNTER — Telehealth: Payer: Self-pay | Admitting: Family Medicine

## 2014-04-06 NOTE — Telephone Encounter (Signed)
Refill 90 days  Need OV regarding  BP and meds to be completed before running out of meds

## 2014-04-06 NOTE — Telephone Encounter (Signed)
Dr. Mamie Nick has filled the patient's lisinopril (PRINIVIL,ZESTRIL) 10 MG tablet for 90 days.  He needs to be seen in the office before the script runs out.  Please help the pt to get on the schedule. Thanks!

## 2014-04-06 NOTE — Telephone Encounter (Signed)
Sent to the pharmacy by e-scribe.  Will send a message to scheduling to help the pt get on the schedule. 

## 2014-04-10 NOTE — Telephone Encounter (Signed)
lmom for pt to sch appt °

## 2014-04-10 NOTE — Telephone Encounter (Signed)
I agree needs ov  In the next 90 days  January would be fine

## 2014-04-13 NOTE — Telephone Encounter (Addendum)
Pt has been sch for 06-12-14

## 2014-04-27 ENCOUNTER — Encounter: Payer: Self-pay | Admitting: Cardiology

## 2014-06-12 ENCOUNTER — Ambulatory Visit (INDEPENDENT_AMBULATORY_CARE_PROVIDER_SITE_OTHER): Payer: BC Managed Care – PPO | Admitting: Internal Medicine

## 2014-06-12 ENCOUNTER — Encounter: Payer: Self-pay | Admitting: Internal Medicine

## 2014-06-12 VITALS — BP 130/80 | Temp 98.1°F | Ht 70.5 in | Wt 227.0 lb

## 2014-06-12 DIAGNOSIS — I422 Other hypertrophic cardiomyopathy: Secondary | ICD-10-CM

## 2014-06-12 DIAGNOSIS — I10 Essential (primary) hypertension: Secondary | ICD-10-CM

## 2014-06-12 DIAGNOSIS — Z1211 Encounter for screening for malignant neoplasm of colon: Secondary | ICD-10-CM

## 2014-06-12 MED ORDER — LISINOPRIL 10 MG PO TABS: ORAL_TABLET | ORAL | Status: DC

## 2014-06-12 NOTE — Patient Instructions (Signed)
Take the bp medication every day for reasons discussed .  Make appt with cardiology  Contact us if need help with referral. Will arrange  Referral  For colon cancer screening .   Get on schedule for wellness check in the next  4 months  ( no labs    Lab Results  Component Value Date   WBC 5.7 02/10/2014   HGB 14.6 02/10/2014   HCT 44.8 02/10/2014   PLT 210.0 02/10/2014   GLUCOSE 76 02/10/2014   CHOL 173 02/10/2014   TRIG 105.0 02/10/2014   HDL 40.90 02/10/2014   LDLCALC 111* 02/10/2014   ALT 36 02/10/2014   AST 26 02/10/2014   NA 136 02/10/2014   K 4.1 02/10/2014   CL 102 02/10/2014   CREATININE 1.2 02/10/2014   BUN 17 02/10/2014   CO2 25 02/10/2014   TSH 0.54 02/10/2014   PSA 2.45 08/20/2012

## 2014-06-12 NOTE — Progress Notes (Signed)
Pre visit review using our clinic review tool, if applicable. No additional management support is needed unless otherwise documented below in the visit note.  Chief Complaint  Patient presents with  . Follow-up    HPI: Paul Yoder 54 y.o. comes in for yearly evaluation  Overdue for this  Father in law died after heart surgery  recentlyu HT:  taking 5 days   Per week .  bp seems up on work days.  No se no low bp  See last visit change in bowel habits Now getting better  Not concerned at this time Not had colonscopy yet.,  Doing better  No cv sx syncope but not as active  No sc[employer] check up.  this year  ROS: See pertinent positives and negatives per HPI. No fever had some sore areas in mouth recnetly poss sinus no rx  better  Past Medical History  Diagnosis Date  . Allergy     seasonal  . Hypertension   . Heart murmur     HCM mild neg ett 2011  . Chest wall pain     ED eval HP had resp depression with morphine IV>    Family History  Problem Relation Age of Onset  . Diabetes Mother   . Hypertension Father   . Hypertension Brother   . ADD / ADHD Son   . Anxiety disorder Son     History   Social History  . Marital Status: Married    Spouse Name: N/A  . Number of Children: N/A  . Years of Education: N/A   Social History Main Topics  . Smoking status: Never Smoker   . Smokeless tobacco: Not on file  . Alcohol Use: Yes  . Drug Use: Not on file  . Sexual Activity: Not on file   Other Topics Concern  . None   Social History Narrative   HHof 3   Son pet dog    worksite Designer, multimedia   Married BS    Active in scouting    Outpatient Encounter Prescriptions as of 06/12/2014  Medication Sig  . lisinopril (PRINIVIL,ZESTRIL) 10 MG tablet TAKE 1 TABLET BY MOUTH DAILY MONDAY THROUGH FRIDAY; DO NOT TAKE ON THE WEEKENDS  . [DISCONTINUED] lisinopril (PRINIVIL,ZESTRIL) 10 MG tablet TAKE 1 TABLET BY MOUTH DAILY MONDAY THROUGH FRIDAY; DO NOT TAKE ON THE WEEKENDS     EXAM:  BP 130/80 mmHg  Temp(Src) 98.1 F (36.7 C) (Oral)  Ht 5' 10.5" (1.791 m)  Wt 227 lb (102.967 kg)  BMI 32.10 kg/m2  Body mass index is 32.1 kg/(m^2).  GENERAL: vitals reviewed and listed above, alert, oriented, appears well hydrated and in no acute distress HEENT: atraumatic, conjunctiva  clear, no obvious abnormalities on inspection of external nose and ears OP : no lesion edema or exudate  NECK: no obvious masses on inspection palpation  LUNGS: clear to auscultation bilaterally, no wheezes, rales or rhonchi, good air movement CV: HRRR, no clubbing cyanosis or  peripheral edema nl cap refill  Soft late sys  murmur ehn supine over upright no radiation  MS: moves all extremities without noticeable focal  abnormality PSYCH: pleasant and cooperative, no obvious depression or anxiety looks a bit tired  Lab Results  Component Value Date   WBC 5.7 02/10/2014   HGB 14.6 02/10/2014   HCT 44.8 02/10/2014   PLT 210.0 02/10/2014   GLUCOSE 76 02/10/2014   CHOL 173 02/10/2014   TRIG 105.0 02/10/2014   HDL 40.90 02/10/2014   [place]  111* 02/10/2014   ALT 36 02/10/2014   AST 26 02/10/2014   NA 136 02/10/2014   K 4.1 02/10/2014   CL 102 02/10/2014   CREATININE 1.2 02/10/2014   BUN 17 02/10/2014   CO2 25 02/10/2014   TSH 0.54 02/10/2014   PSA 2.45 08/20/2012   BP Readings from Last 3 Encounters:  06/12/14 130/80  01/20/14 154/90  10/06/12 114/80   Wt Readings from Last 3 Encounters:  06/12/14 227 lb (102.967 kg)  01/20/14 221 lb (100.245 kg)  10/06/12 223 lb (101.152 kg)     ASSESSMENT AND PLAN:  Discussed the following assessment and plan:  Essential hypertension - take med q d at this time  Hypertrophic cardiomyopathy - apparently no sx  but no major exercise at present  fu over a year murmur present   Colon cancer screening - refer - Plan: Ambulatory referral to Gastroenterology  -Patient advised to return or notify health care team  if symptoms worsen  ,persist or new concerns arise.  Standley Brooking. Panosh M.D.  Patient Instructions   Take the bp medication every day for reasons discussed .  Make appt with cardiology  Contact us if need help with referral. Will arrange  Referral  For colon cancer screening .   Get on schedule for wellness check in the next  4 months  ( no labs    Lab Results  Component Value Date   WBC 5.7 02/10/2014   HGB 14.6 02/10/2014   HCT 44.8 02/10/2014   PLT 210.0 02/10/2014   GLUCOSE 76 02/10/2014   CHOL 173 02/10/2014   TRIG 105.0 02/10/2014   HDL 40.90 02/10/2014   LDLCALC 111* 02/10/2014   ALT 36 02/10/2014   AST 26 02/10/2014   NA 136 02/10/2014   K 4.1 02/10/2014   CL 102 02/10/2014   CREATININE 1.2 02/10/2014   BUN 17 02/10/2014   CO2 25 02/10/2014   TSH 0.54 02/10/2014   PSA 2.45 08/20/2012

## 2014-06-20 ENCOUNTER — Telehealth: Payer: Self-pay | Admitting: Cardiology

## 2014-06-21 ENCOUNTER — Ambulatory Visit: Payer: BC Managed Care – PPO | Admitting: Cardiology

## 2014-06-21 NOTE — Telephone Encounter (Signed)
Closed encounter °

## 2014-06-28 ENCOUNTER — Encounter: Payer: Self-pay | Admitting: Cardiology

## 2014-07-31 ENCOUNTER — Encounter: Payer: Self-pay | Admitting: Cardiology

## 2014-08-14 ENCOUNTER — Encounter: Payer: Self-pay | Admitting: Gastroenterology

## 2014-10-06 ENCOUNTER — Encounter: Payer: BC Managed Care – PPO | Admitting: Internal Medicine

## 2014-10-06 NOTE — Progress Notes (Signed)
Document opened and reviewed for wellness visit . No showed .  

## 2015-02-15 ENCOUNTER — Telehealth: Payer: Self-pay | Admitting: Internal Medicine

## 2015-02-15 NOTE — Telephone Encounter (Signed)
Pt request refill of the following: lisinopril (PRINIVIL,ZESTRIL) 10 MG tablet   Phamacy:  [place]

## 2015-02-16 NOTE — Telephone Encounter (Signed)
Over due for lab work ( BMP ) or cpx    Can refill 1 month worth  Make appt and lab before   Further refills.

## 2015-02-19 MED ORDER — LISINOPRIL 10 MG PO TABS: ORAL_TABLET | ORAL | Status: DC

## 2015-02-19 NOTE — Telephone Encounter (Signed)
No need for t4  Routine  Is ok

## 2015-02-19 NOTE — Telephone Encounter (Signed)
CPX and lab work?  Do you want the t4 free in the lab work like last year?

## 2015-02-20 ENCOUNTER — Other Ambulatory Visit: Payer: Self-pay | Admitting: Family Medicine

## 2015-02-20 DIAGNOSIS — Z0000 Encounter for general adult medical examination without abnormal findings: Secondary | ICD-10-CM

## 2015-02-20 NOTE — Telephone Encounter (Signed)
Left message on vm for pt to call back.  

## 2015-02-20 NOTE — Telephone Encounter (Signed)
Medication sent to the pharmacy.  Lab orders placed in the system.  Please help the pt to make cpx and lab appt.  Thanks!

## 2015-02-22 NOTE — Telephone Encounter (Signed)
Left message with pt wife

## 2015-03-01 NOTE — Telephone Encounter (Signed)
lmom for pt to call back

## 2015-03-01 NOTE — Telephone Encounter (Signed)
Pt called and needs to check his calendar before he can make an appt. Will call back later this afternoon

## 2015-03-09 ENCOUNTER — Other Ambulatory Visit (INDEPENDENT_AMBULATORY_CARE_PROVIDER_SITE_OTHER): Payer: BC Managed Care – PPO

## 2015-03-09 DIAGNOSIS — Z0000 Encounter for general adult medical examination without abnormal findings: Secondary | ICD-10-CM | POA: Diagnosis not present

## 2015-03-09 LAB — CBC WITH DIFFERENTIAL/PLATELET
BASOS ABS: 0 10*3/uL (ref 0.0–0.1)
Basophils Relative: 0.5 % (ref 0.0–3.0)
EOS PCT: 2.8 % (ref 0.0–5.0)
Eosinophils Absolute: 0.1 10*3/uL (ref 0.0–0.7)
HEMATOCRIT: 46 % (ref 39.0–52.0)
Hemoglobin: 15.1 g/dL (ref 13.0–17.0)
Lymphocytes Relative: 32.7 % (ref 12.0–46.0)
Lymphs Abs: 1.6 10*3/uL (ref 0.7–4.0)
MCHC: 32.7 g/dL (ref 30.0–36.0)
MCV: 90.7 fl (ref 78.0–100.0)
MONOS PCT: 8.7 % (ref 3.0–12.0)
Monocytes Absolute: 0.4 10*3/uL (ref 0.1–1.0)
NEUTROS ABS: 2.8 10*3/uL (ref 1.4–7.7)
Neutrophils Relative %: 55.3 % (ref 43.0–77.0)
PLATELETS: 224 10*3/uL (ref 150.0–400.0)
RBC: 5.07 Mil/uL (ref 4.22–5.81)
RDW: 12.7 % (ref 11.5–15.5)
WBC: 5 10*3/uL (ref 4.0–10.5)

## 2015-03-09 LAB — BASIC METABOLIC PANEL
BUN: 12 mg/dL (ref 6–23)
CO2: 28 mEq/L (ref 19–32)
Calcium: 9.2 mg/dL (ref 8.4–10.5)
Chloride: 101 mEq/L (ref 96–112)
Creatinine, Ser: 1.08 mg/dL (ref 0.40–1.50)
GFR: 75.34 mL/min (ref >60.00)
Glucose, Bld: 80 mg/dL (ref 70–99)
POTASSIUM: 4.6 meq/L (ref 3.5–5.1)
SODIUM: 138 meq/L (ref 135–145)

## 2015-03-09 LAB — HEPATIC FUNCTION PANEL
ALBUMIN: 4.3 g/dL (ref 3.5–5.2)
ALT: 29 U/L (ref 0–53)
AST: 20 U/L (ref 0–37)
Alkaline Phosphatase: 43 U/L (ref 39–117)
Bilirubin, Direct: 0.1 mg/dL (ref 0.0–0.3)
TOTAL PROTEIN: 6.6 g/dL (ref 6.0–8.3)
Total Bilirubin: 0.6 mg/dL (ref 0.2–1.2)

## 2015-03-09 LAB — LIPID PANEL
CHOL/HDL RATIO: 5
Cholesterol: 182 mg/dL (ref 0–200)
HDL: 38.7 mg/dL — AB (ref >39.00)
LDL CALC: 117 mg/dL — AB (ref 0–99)
NonHDL: 143.36
TRIGLYCERIDES: 134 mg/dL (ref 0.0–149.0)
VLDL: 26.8 mg/dL (ref 0.0–40.0)

## 2015-03-09 LAB — TSH: TSH: 1.53 u[IU]/mL (ref 0.35–4.50)

## 2015-03-20 ENCOUNTER — Ambulatory Visit (INDEPENDENT_AMBULATORY_CARE_PROVIDER_SITE_OTHER): Payer: BC Managed Care – PPO | Admitting: Internal Medicine

## 2015-03-20 ENCOUNTER — Encounter: Payer: Self-pay | Admitting: Internal Medicine

## 2015-03-20 VITALS — BP 126/74 | Temp 98.3°F | Ht 70.25 in | Wt 222.4 lb

## 2015-03-20 DIAGNOSIS — I422 Other hypertrophic cardiomyopathy: Secondary | ICD-10-CM | POA: Diagnosis not present

## 2015-03-20 DIAGNOSIS — Z0000 Encounter for general adult medical examination without abnormal findings: Secondary | ICD-10-CM | POA: Diagnosis not present

## 2015-03-20 DIAGNOSIS — I10 Essential (primary) hypertension: Secondary | ICD-10-CM | POA: Diagnosis not present

## 2015-03-20 NOTE — Assessment & Plan Note (Signed)
bp control on low dose acei

## 2015-03-20 NOTE — Patient Instructions (Addendum)
Make appt with cardiology .Marland Kitchen Call Gi about  Getting  colonoscopy  Screening .  Continue lifestyle intervention healthy eating and exercise .cholesterol is not at good as last year.  Can ask for hep c screen at next blood draw .    Health Maintenance, Male A healthy lifestyle and preventative care can promote health and wellness.  Maintain regular health, dental, and eye exams.  Eat a healthy diet. Foods like vegetables, fruits, whole grains, low-fat dairy products, and lean protein foods contain the nutrients you need and are low in calories. Decrease your intake of foods high in solid fats, added sugars, and salt. Get information about a proper diet from your health care provider, if necessary.  Regular physical exercise is one of the most important things you can do for your health. Most adults should get at least 150 minutes of moderate-intensity exercise (any activity that increases your heart rate and causes you to sweat) each week. In addition, most adults need muscle-strengthening exercises on 2 or more days a week.   Maintain a healthy weight. The body mass index (BMI) is a screening tool to identify possible weight problems. It provides an estimate of body fat based on height and weight. Your health care provider can find your BMI and can help you achieve or maintain a healthy weight. For males 20 years and older:  A BMI below 18.5 is considered underweight.  A BMI of 18.5 to 24.9 is normal.  A BMI of 25 to 29.9 is considered overweight.  A BMI of 30 and above is considered obese.  Maintain normal blood lipids and cholesterol by exercising and minimizing your intake of saturated fat. Eat a balanced diet with plenty of fruits and vegetables. Blood tests for lipids and cholesterol should begin at age 72 and be repeated every 5 years. If your lipid or cholesterol levels are high, you are over age 33, or you are at high risk for heart disease, you may need your cholesterol levels  checked more frequently.Ongoing high lipid and cholesterol levels should be treated with medicines if diet and exercise are not working.  If you smoke, find out from your health care provider how to quit. If you do not use tobacco, do not start.  Lung cancer screening is recommended for adults aged 67-80 years who are at high risk for developing lung cancer because of a history of smoking. A yearly low-dose CT scan of the lungs is recommended for people who have at least a 30-pack-year history of smoking and are current smokers or have quit within the past 15 years. A pack year of smoking is smoking an average of 1 pack of cigarettes a day for 1 year (for example, a 30-pack-year history of smoking could mean smoking 1 pack a day for 30 years or 2 packs a day for 15 years). Yearly screening should continue until the smoker has stopped smoking for at least 15 years. Yearly screening should be stopped for people who develop a health problem that would prevent them from having lung cancer treatment.  If you choose to drink alcohol, do not have more than 2 drinks per day. One drink is considered to be 12 oz (360 mL) of beer, 5 oz (150 mL) of wine, or 1.5 oz (45 mL) of liquor.  Avoid the use of street drugs. Do not share needles with anyone. Ask for help if you need support or instructions about stopping the use of drugs.  High blood pressure causes heart  heart disease and increases the risk of stroke. High blood pressure is more likely to develop in:  People who have blood pressure in the end of the normal range (100-139/85-89 mm Hg).  People who are overweight or obese.  People who are African American.  If you are 18-39 years of age, have your blood pressure checked every 3-5 years. If you are 40 years of age or older, have your blood pressure checked every year. You should have your blood pressure measured twice--once when you are at a hospital or clinic, and once when you are not at a hospital or clinic.  Record the average of the two measurements. To check your blood pressure when you are not at a hospital or clinic, you can use:  An automated blood pressure machine at a pharmacy.  A home blood pressure monitor.  If you are 45-79 years old, ask your health care provider if you should take aspirin to prevent heart disease.  Diabetes screening involves taking a blood sample to check your fasting blood sugar level. This should be done once every 3 years after age 45 if you are at a normal weight and without risk factors for diabetes. Testing should be considered at a younger age or be carried out more frequently if you are overweight and have at least 1 risk factor for diabetes.  Colorectal cancer can be detected and often prevented. Most routine colorectal cancer screening begins at the age of 50 and continues through age 75. However, your health care provider may recommend screening at an earlier age if you have risk factors for colon cancer. On a yearly basis, your health care provider may provide home test kits to check for hidden blood in the stool. A small camera at the end of a tube may be used to directly examine the colon (sigmoidoscopy or colonoscopy) to detect the earliest forms of colorectal cancer. Talk to your health care provider about this at age 50 when routine screening begins. A direct exam of the colon should be repeated every 5-10 years through age 75, unless early forms of precancerous polyps or small growths are found.  People who are at an increased risk for hepatitis B should be screened for this virus. You are considered at high risk for hepatitis B if:  You were born in a country where hepatitis B occurs often. Talk with your health care provider about which countries are considered high risk.  Your parents were born in a high-risk country and you have not received a shot to protect against hepatitis B (hepatitis B vaccine).  You have HIV or AIDS.  You use needles to  inject street drugs.  You live with, or have sex with, someone who has hepatitis B.  You are a man who has sex with other men (MSM).  You get hemodialysis treatment.  You take certain medicines for conditions like cancer, organ transplantation, and autoimmune conditions.  Hepatitis C blood testing is recommended for all people born from 1945 through 1965 and any individual with known risk factors for hepatitis C.  Healthy men should no longer receive prostate-specific antigen (PSA) blood tests as part of routine cancer screening. Talk to your health care provider about prostate cancer screening.  Testicular cancer screening is not recommended for adolescents or adult males who have no symptoms. Screening includes self-exam, a health care provider exam, and other screening tests. Consult with your health care provider about any symptoms you have or any concerns you have about   cancer.  Practice safe sex. Use condoms and avoid high-risk sexual practices to reduce the spread of sexually transmitted infections (STIs).  You should be screened for STIs, including gonorrhea and chlamydia if:  You are sexually active and are younger than 24 years.  You are older than 24 years, and your health care provider tells you that you are at risk for this type of infection.  Your sexual activity has changed since you were last screened, and you are at an increased risk for chlamydia or gonorrhea. Ask your health care provider if you are at risk.  If you are at risk of being infected with HIV, it is recommended that you take a prescription medicine daily to prevent HIV infection. This is called pre-exposure prophylaxis (PrEP). You are considered at risk if:  You are a man who has sex with other men (MSM).  You are a heterosexual man who is sexually active with multiple partners.  You take drugs by injection.  You are sexually active with a partner who has HIV.  Talk with your health care  provider about whether you are at high risk of being infected with HIV. If you choose to begin PrEP, you should first be tested for HIV. You should then be tested every 3 months for as long as you are taking PrEP.  Use sunscreen. Apply sunscreen liberally and repeatedly throughout the day. You should seek shade when your shadow is shorter than you. Protect yourself by wearing long sleeves, pants, a wide-brimmed hat, and sunglasses year round whenever you are outdoors.  Tell your health care provider of new moles or changes in moles, especially if there is a change in shape or color. Also, tell your health care provider if a mole is larger than the size of a pencil eraser.  A one-time screening for abdominal aortic aneurysm (AAA) and surgical repair of large AAAs by ultrasound is recommended for men aged 27-75 years who are current or former smokers.  Stay current with your vaccines (immunizations).   This information is not intended to replace advice given to you by your health care provider. Make sure you discuss any questions you have with your health care provider.   Document Released: 10/04/2007 Document Revised: 04/28/2014 Document Reviewed: 09/02/2010 Elsevier Interactive Patient Education [employer].

## 2015-03-20 NOTE — Progress Notes (Signed)
Pre visit review using our clinic review tool, if applicable. No additional management support is needed unless otherwise documented below in the visit note.  Chief Complaint  Patient presents with  . Annual Exam    scouts form     HPI: Patient  Paul Yoder  54 y.o. comes in today for Preventive Health Care visit  And bp check and form for scouts  Since last visit family loss but doing ok   Doesn't remember getting letter for colon or cards appt  Know he needs colon cancer screening .   Health Maintenance  Topic Date Due  . Hepatitis C Screening  10/15/1960  . COLONOSCOPY  10/12/2009  . HIV Screening  03/19/2016 (Originally 10/13/1975)  . INFLUENZA VACCINE  11/20/2015  . TETANUS/TDAP  10/18/2018   Health Maintenance Review LIFESTYLE:  Exercise:  No motivations work etc  Tobacco/ETS:n Alcohol: per day n Sugar beverages: ocass gatorade flavoring Sleep:7-8 hours  Drug use: no   ROS:  Check rectal tag no pain bleeding GEN/ HEENT: No fever, significant weight changes sweats headaches vision problems hearing changes, CV/ PULM; No chest pain shortness of breath cough, syncope,edema  change in exercise tolerance. GI /GU: No adominal pain, vomiting, change in bowel habits. No blood in the stool. No significant GU symptoms. SKIN/HEME: ,no acute skin rashes suspicious lesions or bleeding. No lymphadenopathy, nodules, masses.  NEURO/ PSYCH:  No neurologic signs such as weakness numbness. No depression anxiety. IMM/ Allergy: No unusual infections.  Allergy .   REST of 12 system review negative except as per HPI   Past Medical History  Diagnosis Date  . Allergy     seasonal  . Hypertension   . Heart murmur     HCM mild neg ett 2011  . Chest wall pain     ED eval HP had resp depression with morphine IV>    Past Surgical History  Procedure Laterality Date  . Mole removal    . Other surgical history      finger cyst removal    Family History  Problem Relation Age of  Onset  . Diabetes Mother   . Hypertension Father   . Hypertension Brother   . ADD / ADHD Son   . Anxiety disorder Son     Social History   Social History  . Marital Status: Married    Spouse Name: N/A  . Number of Children: N/A  . Years of Education: N/A   Social History Main Topics  . Smoking status: Never Smoker   . Smokeless tobacco: None  . Alcohol Use: Yes  . Drug Use: None  . Sexual Activity: Not Asked   Other Topics Concern  . 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 lisinopril (PRINIVIL,ZESTRIL) 10 MG tablet TAKE 1 TABLET BY MOUTH DAILY MONDAY THROUGH FRIDAY; DO NOT TAKE ON THE WEEKENDS 30 tablet 0   No facility-administered medications prior to visit.     EXAM:  BP 126/74 mmHg  Temp(Src) 98.3 F (36.8 C) (Oral)  Ht 5' 10.25" (1.784 m)  Wt 222 lb 6.4 oz (100.88 kg)  BMI 31.70 kg/m2  Body mass index is 31.7 kg/(m^2).  Physical Exam: Vital signs reviewed [id number] is a well-developed well-nourished alert cooperative    who appearsr stated age  in no acute distress.  HEENT: normocephalic atraumatic , Eyes: PERRL EOM's full, conjunctiva clear, Nares: paten,t no deformity discharge or tenderness., Ears: no deformity EAC's clear TMs with normal landmarks. Mouth: clear OP, no lesions, edema.  Moist mucous membranes. Dentition in adequate repair. NECK: supple without masses, thyromegaly or bruits. CHEST/PULM:  Clear to auscultation and percussion breath sounds equal no wheeze , rales or rhonchi. No chest wall deformities or tenderness. CV: PMI is nondisplaced, S1 S2 no gallops, 1-2 systolic m supine  No radiation  And quiet precordium no, rubs. Peripheral pulses are full without delay.No JVD .  ABDOMEN: Bowel sounds normal nontender  No guard or rebound, no hepato splenomegal no CVA tenderness.  No  hernia. Extremtities:  No clubbing cyanosis or edema, no acute joint swelling or redness no focal atrophy NEURO:  Oriented x3, cranial nerves 3-12 appear to be intact, no obvious focal weakness,gait within normal limits no abnormal reflexes or asymmetrical SKIN: No acute rashes normal turgor, color, no bruising or petechiae. Rectal area multiple tags some whit colored but dont really seem verrououse PSYCH: Oriented, good eye contact, no obvious depression anxiety, cognition and judgment appear normal. LN: no cervical axillary inguinal adenopathy  Lab Results  Component Value Date   WBC 5.0 03/09/2015   HGB 15.1 03/09/2015   HCT 46.0 03/09/2015   PLT 224.0 03/09/2015   GLUCOSE 80 03/09/2015   CHOL 182 03/09/2015   TRIG 134.0 03/09/2015   HDL 38.70* 03/09/2015   LDLCALC 117* 03/09/2015   ALT 29 03/09/2015   AST 20 03/09/2015   NA 138 03/09/2015   K 4.6 03/09/2015   CL 101 03/09/2015   CREATININE 1.08 03/09/2015   BUN 12 03/09/2015   CO2 28 03/09/2015   TSH 1.53 03/09/2015   PSA 2.45 08/20/2012   BP Readings from Last 3 Encounters:  03/20/15 126/74  06/12/14 130/80  01/20/14 154/90     ASSESSMENT AND PLAN:  Discussed the following assessment and plan:  Visit for preventive health examination  Essential hypertension  Hypertrophic cardiomyopathy ([place]) Form completed and signed  reviewed     HCM  Was exposed to blood in 1994 had neg hiv  Can get hep c screen next blood tests   Patient Care Team: Burnis Medin, MD as PCP - General Minus Breeding, MD (Cardiology) Patient Instructions  Make appt with cardiology .Marland Kitchen Call Gi about  Getting  colonoscopy  Screening .  Continue lifestyle intervention healthy eating and exercise .cholesterol is not at good as last year.  Can ask for hep c screen at next blood draw .    Health Maintenance, Male A healthy lifestyle and preventative care can promote health and wellness.  Maintain regular health, dental, and eye  exams.  Eat a healthy diet. Foods like vegetables, fruits, whole grains, low-fat dairy products, and lean protein foods contain the nutrients you need and are low in calories. Decrease your intake of foods high in solid fats, added sugars, and salt. Get information about a proper diet from your health care provider, if necessary.  Regular physical exercise is one of the most important things you can do for your health. Most adults should get at least 150 minutes of moderate-intensity exercise (any activity that increases your heart rate and causes you to sweat) each week. In addition, most adults need muscle-strengthening exercises on 2 or more days a week.   Maintain a healthy weight. The body mass index (BMI) is a screening tool to identify possible weight  problems. It provides an estimate of body fat based on height and weight. Your health care provider can find your BMI and can help you achieve or maintain a healthy weight. For males 20 years and older:  A BMI below 18.5 is considered underweight.  A BMI of 18.5 to 24.9 is normal.  A BMI of 25 to 29.9 is considered overweight.  A BMI of 30 and above is considered obese.  Maintain normal blood lipids and cholesterol by exercising and minimizing your intake of saturated fat. Eat a balanced diet with plenty of fruits and vegetables. Blood tests for lipids and cholesterol should begin at age 50 and be repeated every 5 years. If your lipid or cholesterol levels are high, you are over age 38, or you are at high risk for heart disease, you may need your cholesterol levels checked more frequently.Ongoing high lipid and cholesterol levels should be treated with medicines if diet and exercise are not working.  If you smoke, find out from your health care provider how to quit. If you do not use tobacco, do not start.  Lung cancer screening is recommended for adults aged 64-80 years who are at high risk for developing lung cancer because of a history  of smoking. A yearly low-dose CT scan of the lungs is recommended for people who have at least a 30-pack-year history of smoking and are current smokers or have quit within the past 15 years. A pack year of smoking is smoking an average of 1 pack of cigarettes a day for 1 year (for example, a 30-pack-year history of smoking could mean smoking 1 pack a day for 30 years or 2 packs a day for 15 years). Yearly screening should continue until the smoker has stopped smoking for at least 15 years. Yearly screening should be stopped for people who develop a health problem that would prevent them from having lung cancer treatment.  If you choose to drink alcohol, do not have more than 2 drinks per day. One drink is considered to be 12 oz (360 mL) of beer, 5 oz (150 mL) of wine, or 1.5 oz (45 mL) of liquor.  Avoid the use of street drugs. Do not share needles with anyone. Ask for help if you need support or instructions about stopping the use of drugs.  High blood pressure causes heart disease and increases the risk of stroke. High blood pressure is more likely to develop in:  People who have blood pressure in the end of the normal range (100-139/85-89 mm Hg).  People who are overweight or obese.  People who are African American.  If you are 43-51 years of age, have your blood pressure checked every 3-5 years. If you are 101 years of age or older, have your blood pressure checked every year. You should have your blood pressure measured twice--once when you are at a hospital or clinic, and once when you are not at a hospital or clinic. Record the average of the two measurements. To check your blood pressure when you are not at a hospital or clinic, you can use:  An automated blood pressure machine at a pharmacy.  A home blood pressure monitor.  If you are 86-25 years old, ask your health care provider if you should take aspirin to prevent heart disease.  Diabetes screening involves taking a blood sample to  check your fasting blood sugar level. This should be done once every 3 years after age 80 if you are at a normal  weight and without risk factors for diabetes. Testing should be considered at a younger age or be carried out more frequently if you are overweight and have at least 1 risk factor for diabetes.  Colorectal cancer can be detected and often prevented. Most routine colorectal cancer screening begins at the age of 53 and continues through age 26. However, your health care provider may recommend screening at an earlier age if you have risk factors for colon cancer. On a yearly basis, your health care provider may provide home test kits to check for hidden blood in the stool. A small camera at the end of a tube may be used to directly examine the colon (sigmoidoscopy or colonoscopy) to detect the earliest forms of colorectal cancer. Talk to your health care provider about this at age 10 when routine screening begins. A direct exam of the colon should be repeated every 5-10 years through age 62, unless early forms of precancerous polyps or small growths are found.  People who are at an increased risk for hepatitis B should be screened for this virus. You are considered at high risk for hepatitis B if:  You were born in a country where hepatitis B occurs often. Talk with your health care provider about which countries are considered high risk.  Your parents were born in a high-risk country and you have not received a shot to protect against hepatitis B (hepatitis B vaccine).  You have HIV or AIDS.  You use needles to inject street drugs.  You live with, or have sex with, someone who has hepatitis B.  You are a man who has sex with other men (MSM).  You get hemodialysis treatment.  You take certain medicines for conditions like cancer, organ transplantation, and autoimmune conditions.  Hepatitis C blood testing is recommended for all people born from [birth year] through 1966 and any individual with  known risk factors for hepatitis C.  Healthy men should no longer receive prostate-specific antigen (PSA) blood tests as part of routine cancer screening. Talk to your health care provider about prostate cancer screening.  Testicular cancer screening is not recommended for adolescents or adult males who have no symptoms. Screening includes self-exam, a health care provider exam, and other screening tests. Consult with your health care provider about any symptoms you have or any concerns you have about testicular cancer.  Practice safe sex. Use condoms and avoid high-risk sexual practices to reduce the spread of sexually transmitted infections (STIs).  You should be screened for STIs, including gonorrhea and chlamydia if:  You are sexually active and are younger than 24 years.  You are older than 24 years, and your health care provider tells you that you are at risk for this type of infection.  Your sexual activity has changed since you were last screened, and you are at an increased risk for chlamydia or gonorrhea. Ask your health care provider if you are at risk.  If you are at risk of being infected with HIV, it is recommended that you take a prescription medicine daily to prevent HIV infection. This is called pre-exposure prophylaxis (PrEP). You are considered at risk if:  You are a man who has sex with other men (MSM).  You are a heterosexual man who is sexually active with multiple partners.  You take drugs by injection.  You are sexually active with a partner who has HIV.  Talk with your health care provider about whether you are at high risk of being infected with  HIV. If you choose to begin PrEP, you should first be tested for HIV. You should then be tested every 3 months for as long as you are taking PrEP.  Use sunscreen. Apply sunscreen liberally and repeatedly throughout the day. You should seek shade when your shadow is shorter than you. Protect yourself by wearing long  sleeves, pants, a wide-brimmed hat, and sunglasses year round whenever you are outdoors.  Tell your health care provider of new moles or changes in moles, especially if there is a change in shape or color. Also, tell your health care provider if a mole is larger than the size of a pencil eraser.  A one-time screening for abdominal aortic aneurysm (AAA) and surgical repair of large AAAs by ultrasound is recommended for men aged 20-75 years who are current or former smokers.  Stay current with your vaccines (immunizations).   This information is not intended to replace advice given to you by your health care provider. Make sure you discuss any questions you have with your health care provider.   Document Released: 10/04/2007 Document Revised: 04/28/2014 Document Reviewed: 09/02/2010 Elsevier Interactive Patient Education 2016 [place] K. Antonyo Hinderer M.D.

## 2015-03-20 NOTE — Assessment & Plan Note (Signed)
Still needs cars fu wasn't aware of appt  Last spring will call . No sx at this time but needs fu  Not really exercising

## 2015-04-02 ENCOUNTER — Telehealth: Payer: Self-pay | Admitting: Internal Medicine

## 2015-04-02 NOTE — Telephone Encounter (Signed)
Mrs. Prajapati called saying Dr. Regis Bill told her the $9 No Show fee would be waved for the CPE scheduled on 6.17.16 because the pt wasn't aware of that appt. The fee is still showing up in his file. I gave her the phone number to the Billing Office. Please give her a call regarding this.  Pt's ph# [phone] Thank you.

## 2015-04-02 NOTE — Telephone Encounter (Signed)
Will forward for help waiving the fee

## 2015-04-02 NOTE — Telephone Encounter (Signed)
Will check with WP to see if she is waiving this fee.

## 2015-04-02 NOTE — Telephone Encounter (Signed)
Ok to waive the fee.  

## 2015-05-08 ENCOUNTER — Other Ambulatory Visit: Payer: Self-pay | Admitting: Internal Medicine

## 2015-05-09 NOTE — Telephone Encounter (Signed)
Sent to the pharmacy by e-scribe. 

## 2015-12-20 ENCOUNTER — Telehealth: Payer: Self-pay | Admitting: Internal Medicine

## 2015-12-20 NOTE — Telephone Encounter (Signed)
[facility] - Client [place] [facility] Patient Name: Paul Yoder DOB: [birth year] Initial Comment Caller states he is having testicular pain and lower groin pain. Nurse Assessment Guidelines Guideline Title Affirmed Question Affirmed Notes Final Disposition User FINAL ATTEMPT MADE - no message left Dimas Chyle, Therapist, sports, Dellis Filbert

## 2015-12-21 NOTE — Telephone Encounter (Signed)
Spoke to Mrs. Moschella (wife) and asked that the pt return my call.  She will have the pt call back.

## 2015-12-25 NOTE — Telephone Encounter (Signed)
Spoke to patient's wife.  Pt is currently traveling.  Wife will contact the pt to have him call the office.

## 2015-12-28 NOTE — Telephone Encounter (Signed)
Received a busy signal.  Will try again at a later time/date.

## 2016-01-02 ENCOUNTER — Telehealth: Payer: Self-pay | Admitting: Internal Medicine

## 2016-01-02 NOTE — Telephone Encounter (Signed)
Left a message for a return call.  Will now close note.  Have made multiple attempts to reach the pt.

## 2016-01-02 NOTE — Telephone Encounter (Signed)
Misty pt returned your call and would like a call back.

## 2016-01-03 NOTE — Telephone Encounter (Signed)
Left a message for the pt to return my call.  Need to speak to the pt concerning triage note from 12/20/15.

## 2016-01-09 NOTE — Telephone Encounter (Signed)
Left a message for a return call.  Will now close the note.  Pt has not returned my call.  Also made several attempts in the past to reach him.  See note from 12/20/15.

## 2018-05-18 NOTE — Progress Notes (Signed)
Chief Complaint  Patient presents with  . Rash    x 2 weeks started ad 3 dots in a circle and had been applying anti-fungla spray. pt states it is icthy. 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 Uses zest soap     HPI: Paul Yoder 58 y.o. come in for SDA   Problem   Last visit was over 3 years ago  Has been well off the lisinopril cuaes" bp has been good " No sx   See above  Itchy back patches for months   Off and on  No rx  Right upper arm red patch scaly for about 2 weeks and using otc spray tinactin with out help and now has a  New smaller one near  nospecific contact hx of same . No pets  Wife no  Rash .  ROS: See pertinent positives and negatives per HPI. No cp sob has gained wieght   Past Medical History:  Diagnosis Date  . Allergy    seasonal  . Chest wall pain    ED eval HP had resp depression with morphine IV>  . Heart murmur    HCM mild neg ett 2011  . Hypertension     Family History  Problem Relation Age of Onset  . Diabetes Mother   . Hypertension Father   . Hypertension Brother   . ADD / ADHD Son   . Anxiety disorder Son     Social History   Socioeconomic History  . Marital status: Married    Spouse name: Not on file  . Number of children: Not on file  . Years of education: Not on file  . Highest education level: Not on file  Occupational History  . Not on file  Social Needs  . Financial resource strain: Not on file  . Food insecurity:    Worry: Not on file    Inability: Not on file  . Transportation needs:    Medical: Not on file    Non-medical: Not on file  Tobacco Use  . Smoking status: Never Smoker  Substance and Sexual Activity  . Alcohol use: Yes  . Drug use: Not on file  . Sexual activity: Not on file  Lifestyle  . Physical activity:    Days per week: Not on file    Minutes per session: Not on file  . Stress: Not on file  Relationships  . Social connections:    Talks on phone: Not on file    Gets  together: Not on file    Attends religious service: Not on file    Active member of club or organization: Not on file    Attends meetings of clubs or organizations: Not on file    Relationship status: Not on file  Other Topics Concern  . Not on file  Social History Narrative   HHof 3   Son pet dog    worksite Designer, multimedia   Married BS    Active in scouting    Outpatient Medications Prior to Visit  Medication Sig Dispense Refill  . lisinopril (PRINIVIL,ZESTRIL) 10 MG tablet TAKE 1 TABLET BY MOUTH EVERY DAY MONDAY THROUGH FRIDAY. DO NOT TAKE ON WEEKEND (Patient not taking: Reported on 05/19/2018) 30 tablet 4   No facility-administered medications prior to visit.      EXAM:  BP 132/88 (BP Location: Right Arm, Patient Position: Sitting, Cuff Size: Normal)   Pulse 65   Temp 98.1 F (36.7 C) (Oral)  Wt 223 lb 4.8 oz (101.3 kg)   BMI 31.81 kg/m   Body mass index is 31.81 kg/m.  GENERAL: vitals reviewed and listed above, alert, oriented, appears well hydrated and in no acute distress HEENT: atraumatic, conjunctiva  clear, no obvious abnormalities on inspection of external nose and ears  NECK: no obvious masses on inspection palpation  Skin: normal capillary refill ,turgor , color: right upper biceps are with round scaly  Thickened  Round area with ?  ? Borders  And s1 cm area next   Back with dry patches  Excoriated  No scaling noted  Trunk mostly clear        MS: moves all extremities without noticeable focal  abnormality PSYCH: pleasant and cooperative, no obvious depression or anxiety  BP Readings from Last 3 Encounters:  05/19/18 132/88  03/20/15 126/74  06/12/14 130/80    ASSESSMENT AND PLAN:  Discussed the following assessment and plan:  Rash - pruritic  lab order for PV - Plan: Basic metabolic panel, CBC with Differential/Platelet, Hepatic function panel, Lipid panel, PSA  Essential hypertension - check readings and send in has been off med  - Plan: Basic  metabolic panel, CBC with Differential/Platelet, Hepatic function panel, Lipid panel, PSA  Hypertrophic cardiomyopathy (HCC) - Plan: Basic metabolic panel, CBC with Differential/Platelet, Hepatic function panel, Lipid panel, PSA  Hyperlipidemia, unspecified hyperlipidemia type - Plan: Basic metabolic panel, CBC with Differential/Platelet, Hepatic function panel, Lipid panel, PSA  Screening PSA (prostate specific antigen) future lab - Plan: Basic metabolic panel, CBC with Differential/Platelet, Hepatic function panel, Lipid panel, PSA Over due for cpx and PV  Etc  Monitor bp and plan cpx lab pre visit  Rash x 2  empiric rx  And fu if  persistent or progressive  No psoriasis hx   Exposures or eczema hx?  Need fu bp and  Monitoring   Fasting lab orders placed  And plan fu lab and cpx  -Patient advised to return or notify health care team  if  new concerns arise.  Patient Instructions  treating for fungus and  Eczema allergic rash  But not sure .  Use both for 2 weeks and if not getting anywhere plan dermatology advice    Standley Brooking. Panosh M.D.

## 2018-05-19 ENCOUNTER — Encounter: Payer: Self-pay | Admitting: Internal Medicine

## 2018-05-19 ENCOUNTER — Ambulatory Visit: Payer: BC Managed Care – PPO | Admitting: Internal Medicine

## 2018-05-19 VITALS — BP 132/88 | HR 65 | Temp 98.1°F | Wt 223.3 lb

## 2018-05-19 DIAGNOSIS — I10 Essential (primary) hypertension: Secondary | ICD-10-CM

## 2018-05-19 DIAGNOSIS — R21 Rash and other nonspecific skin eruption: Secondary | ICD-10-CM | POA: Diagnosis not present

## 2018-05-19 DIAGNOSIS — Z0000 Encounter for general adult medical examination without abnormal findings: Secondary | ICD-10-CM

## 2018-05-19 DIAGNOSIS — E785 Hyperlipidemia, unspecified: Secondary | ICD-10-CM

## 2018-05-19 DIAGNOSIS — I422 Other hypertrophic cardiomyopathy: Secondary | ICD-10-CM

## 2018-05-19 DIAGNOSIS — Z125 Encounter for screening for malignant neoplasm of prostate: Secondary | ICD-10-CM

## 2018-05-19 MED ORDER — FLUOCINONIDE-E 0.05 % EX CREA: 1.0000 "application " | TOPICAL_CREAM | Freq: Two times a day (BID) | CUTANEOUS | 0 refills | Status: DC

## 2018-05-19 MED ORDER — KETOCONAZOLE 2 % EX CREA: 1.0000 "application " | TOPICAL_CREAM | Freq: Two times a day (BID) | CUTANEOUS | 1 refills | Status: DC

## 2018-05-19 NOTE — Patient Instructions (Addendum)
treating for fungus and  Eczema allergic rash  But not sure .  Use both for 2 weeks and if not getting anywhere plan dermatology advice

## 2018-05-21 ENCOUNTER — Encounter: Payer: Self-pay | Admitting: Internal Medicine

## 2019-01-18 ENCOUNTER — Telehealth (INDEPENDENT_AMBULATORY_CARE_PROVIDER_SITE_OTHER): Payer: BC Managed Care – PPO | Admitting: Internal Medicine

## 2019-01-18 ENCOUNTER — Other Ambulatory Visit: Payer: Self-pay

## 2019-01-18 ENCOUNTER — Encounter: Payer: Self-pay | Admitting: Internal Medicine

## 2019-01-18 DIAGNOSIS — R0989 Other specified symptoms and signs involving the circulatory and respiratory systems: Secondary | ICD-10-CM | POA: Diagnosis not present

## 2019-01-18 DIAGNOSIS — R6889 Other general symptoms and signs: Secondary | ICD-10-CM

## 2019-01-18 NOTE — Progress Notes (Signed)
Virtual Visit via Video Note  I connected with@ on 01/18/19 at  3:30 PM EDT by a video enabled telemedicine application and verified that I am speaking with the correct person using two identifiers. Location patient: home Location provider:work  office Persons participating in the virtual visit: patient, provider  [place] national recommendations  regarding COVID 19 pandemic   video visit is advised over in office visit for this patient.  Patient aware  of the limitations of evaluation and management by telemedicine and  availability of in person appointments. and agreed to proceed.   HPI: Paul Yoder presents for video visit he is complaining of a tickle feeling on the right side of his throat further down than where he can see for about 2 weeks.  He does not believe he choked on things although wonders if it he got some food in that area no allergy or stuffiness or cold symptoms but might have some drainage at some point.  There is no fever face pain or serious cough. A number of years ago he had episodes where he had similar symptoms and noticed he had a 5 inch hair that did gone down his pharyngeal area that was tickling his throat pulled it out and was fine. He feels no adenopathy but feels the area that he thinks is involved. He is not on any regular medication for blood pressure says it is running around 130/86 the last time he checked it a few months ago. His last check with cardiology was probably 5 to 6 years ago although he did not think it was that long has no symptoms of his HCM but was noted to have some LVH at that time.  Currently he can exercise and has no symptoms.  Had a dental checkup about 10 days before the onset of symptoms but there was nothing of concern. He works a [employer] and is staying isolated health care is been delayed because of isolation.  But he generally feels well.     ROS: See pertinent positives and negatives per HPI.  No chest pain shortness of breath  syncope.  Past Medical History:  Diagnosis Date  . Allergy    seasonal  . Chest wall pain    ED eval [facility] had resp depression with morphine IV>  . Heart murmur    HCM mild neg ett 2011  . Hypertension     Past Surgical History:  Procedure Laterality Date  . MOLE REMOVAL    . OTHER SURGICAL HISTORY     finger cyst removal    Family History  Problem Relation Age of Onset  . Diabetes Mother   . Hypertension Father   . Hypertension Brother   . ADD / ADHD Son   . Anxiety disorder Son     Social History   Tobacco Use  . Smoking status: Never Smoker  . Smokeless tobacco: Never Used  Substance Use Topics  . Alcohol use: Yes  . Drug use: Not on file     No current outpatient medications on file.  EXAM: BP Readings from Last 3 Encounters:  05/19/18 132/88  03/20/15 126/74  06/12/14 130/80    VITALS per patient if applicable:  GENERAL: alert, oriented, appears well and in no acute distress  HEENT: atraumatic, conjunttiva clear, no obvious abnormalities on inspection of external nose and ears  NECK: normal movements of the head and neck points to the area mid to upper right lateral neck.  No nodules noted.  LUNGS: on  inspection no signs of respiratory distress, breathing rate appears normal, no obvious gross SOB, gasping or wheezing  CV: no obvious cyanosis   PSYCH/NEURO: pleasant and cooperative, no obvious depression or anxiety, speech and thought processing grossly intact Lab Results  Component Value Date   WBC 5.0 03/09/2015   HGB 15.1 03/09/2015   HCT 46.0 03/09/2015   PLT 224.0 03/09/2015   GLUCOSE 80 03/09/2015   CHOL 182 03/09/2015   TRIG 134.0 03/09/2015   HDL 38.70 (L) 03/09/2015   LDLCALC 117 (H) 03/09/2015   ALT 29 03/09/2015   AST 20 03/09/2015   NA 138 03/09/2015   K 4.6 03/09/2015   CL 101 03/09/2015   CREATININE 1.08 03/09/2015   BUN 12 03/09/2015   CO2 28 03/09/2015   TSH 1.53 03/09/2015   PSA 2.45 08/20/2012    ASSESSMENT AND  PLAN:  Discussed the following assessment and plan:    ICD-10-CM   1. Throat symptom  R68.89   2. Tickle in throat  R09.89    Right sided "tickle in throat feeling of foreign body without history of same.  Comes and goes.  Empiric saline and Flonase for 1 to 2 weeks and if persistent progressive he will contact us and we will do referral to ENT. Also discussed needs monitoring of blood pressure and follow-up with cardiology. Also plan to make his appointment for CPX with labs. He can call for flu shot appointment if needed.  Counseled.   Expectant management and discussion of plan and treatment with opportunity to ask questions and all were answered. The patient agreed with the plan and demonstrated an understanding of the instructions.   Advised to call back or seek an in-person evaluation if worsening  or having  further concerns .    Shanon Ace, MD

## 2020-07-13 ENCOUNTER — Other Ambulatory Visit: Payer: Self-pay

## 2020-07-16 ENCOUNTER — Other Ambulatory Visit: Payer: Self-pay

## 2020-07-16 ENCOUNTER — Encounter: Payer: Self-pay | Admitting: Internal Medicine

## 2020-07-16 ENCOUNTER — Ambulatory Visit (INDEPENDENT_AMBULATORY_CARE_PROVIDER_SITE_OTHER): Payer: BC Managed Care – PPO | Admitting: Internal Medicine

## 2020-07-16 VITALS — BP 150/102 | HR 67 | Temp 98.2°F | Ht 71.5 in | Wt 220.2 lb

## 2020-07-16 DIAGNOSIS — Z1211 Encounter for screening for malignant neoplasm of colon: Secondary | ICD-10-CM | POA: Diagnosis not present

## 2020-07-16 DIAGNOSIS — Z23 Encounter for immunization: Secondary | ICD-10-CM

## 2020-07-16 DIAGNOSIS — Z125 Encounter for screening for malignant neoplasm of prostate: Secondary | ICD-10-CM

## 2020-07-16 DIAGNOSIS — I422 Other hypertrophic cardiomyopathy: Secondary | ICD-10-CM

## 2020-07-16 DIAGNOSIS — I10 Essential (primary) hypertension: Secondary | ICD-10-CM

## 2020-07-16 DIAGNOSIS — K644 Residual hemorrhoidal skin tags: Secondary | ICD-10-CM

## 2020-07-16 DIAGNOSIS — Z0000 Encounter for general adult medical examination without abnormal findings: Secondary | ICD-10-CM | POA: Diagnosis not present

## 2020-07-16 DIAGNOSIS — Z79899 Other long term (current) drug therapy: Secondary | ICD-10-CM

## 2020-07-16 MED ORDER — AMLODIPINE BESYLATE-VALSARTAN 5-160 MG PO TABS: 1.0000 | ORAL_TABLET | Freq: Every day | ORAL | 3 refills | Status: DC

## 2020-07-16 NOTE — Patient Instructions (Addendum)
Will refer for colonoscopy.   BP needs control    bp goal below 130/80  Healthy weigh loss .  Dash eating Please bring your blood pressure cuff to next appointment Take blood pressure readings twice a day for5-7 days and record .     Take 2 -3 readings at each sitting .   Can send in readings  by My Chart.    Before checking your blood pressure make sure: You are seated and quite for 5 min before checking Feet are flat on the floor Siting in chair with your back supported straight up and down Arm resting on table or arm of chair at heart level Bladder is empty You have NOT had caffeine or tobacco within the last 30 min  [email or web address]  Plan ROV or fu  virtual ok in  1 month  May need updated  Echo cardiology eval depending.  Will do record review   begn  bp medication ( dual  med)   If bp too low  Then split in 1/2      [email or web address]">  DASH Eating Plan DASH stands for Dietary Approaches to Stop Hypertension. The DASH eating plan is a healthy eating plan that has been shown to:  Reduce high blood pressure (hypertension).  Reduce your risk for type 2 diabetes, heart disease, and stroke.  Help with weight loss. What are tips for following this plan? Reading food labels  Check food labels for the amount of salt (sodium) per serving. Choose foods with less than 5 percent of the Daily Value of sodium. Generally, foods with less than 300 milligrams (mg) of sodium per serving fit into this eating plan.  To find whole grains, look for the word "whole" as the first word in the ingredient list. Shopping  Buy products labeled as "low-sodium" or "no salt added."  Buy fresh foods. Avoid canned foods and pre-made or frozen meals. Cooking  Avoid adding salt when cooking. Use salt-free seasonings or herbs instead of table salt or sea salt. Check with your health care provider or pharmacist before using salt substitutes.  Do not  fry foods. Cook foods using healthy methods such as baking, boiling, grilling, roasting, and broiling instead.  Cook with heart-healthy oils, such as olive, canola, avocado, soybean, or sunflower oil. Meal planning  Eat a balanced diet that includes: ? 4 or more servings of fruits and 4 or more servings of vegetables each day. Try to fill one-half of your plate with fruits and vegetables. ? 6-8 servings of whole grains each day. ? Less than 6 oz (170 g) of lean meat, poultry, or fish each day. A 3-oz (85-g) serving of meat is about the same size as a deck of cards. One egg equals 1 oz (28 g). ? 2-3 servings of low-fat dairy each day. One serving is 1 cup (237 mL). ? 1 serving of nuts, seeds, or beans 5 times each week. ? 2-3 servings of heart-healthy fats. Healthy fats called omega-3 fatty acids are found in foods such as walnuts, flaxseeds, fortified milks, and eggs. These fats are also found in cold-water fish, such as sardines, salmon, and mackerel.  Limit how much you eat of: ? Canned or prepackaged foods. ? Food that is high in trans fat, such as some fried foods. ? Food that is high in saturated fat, such as fatty meat. ? Desserts and other sweets, sugary drinks, and other foods with added sugar. ? Full-fat dairy products.  Do not salt foods before eating.  Do not eat more than 4 egg yolks a week.  Try to eat at least 2 vegetarian meals a week.  Eat more home-cooked food and less restaurant, buffet, and fast food.   Lifestyle  When eating at a restaurant, ask that your food be prepared with less salt or no salt, if possible.  If you drink alcohol: ? Limit how much you use to:  0-1 drink a day for women who are not pregnant.  0-2 drinks a day for men. ? Be aware of how much alcohol is in your drink. In the [place] one drink equals one 12 oz bottle of beer (355 mL), one 5 oz glass of wine (148 mL), or one 1 oz glass of hard liquor (44 mL). General information  Avoid eating  more than 2,300 mg of salt a day. If you have hypertension, you may need to reduce your sodium intake to 1,500 mg a day.  Work with your health care provider to maintain a healthy body weight or to lose weight. Ask what an ideal weight is for you.  Get at least 30 minutes of exercise that causes your heart to beat faster (aerobic exercise) most days of the week. Activities may include walking, swimming, or biking.  Work with your health care provider or dietitian to adjust your eating plan to your individual calorie needs. What foods should I eat? Fruits All fresh, dried, or frozen fruit. Canned fruit in natural juice (without added sugar). Vegetables Fresh or frozen vegetables (raw, steamed, roasted, or grilled). Low-sodium or reduced-sodium tomato and vegetable juice. Low-sodium or reduced-sodium tomato sauce and tomato paste. Low-sodium or reduced-sodium canned vegetables. Grains Whole-grain or whole-wheat bread. Whole-grain or whole-wheat pasta. Brown rice. [place]. Bulgur. Whole-grain and low-sodium cereals. Pita bread. Low-fat, low-sodium crackers. Whole-wheat flour tortillas. Meats and other proteins Skinless chicken or [place]. Ground chicken or [place]. Pork with fat trimmed off. Fish and seafood. Egg whites. Dried beans, peas, or lentils. Unsalted nuts, nut butters, and seeds. Unsalted canned beans. Lean cuts of beef with fat trimmed off. Low-sodium, lean precooked or cured meat, such as sausages or meat loaves. Dairy Low-fat (1%) or fat-free (skim) milk. Reduced-fat, low-fat, or fat-free cheeses. Nonfat, low-sodium ricotta or cottage cheese. Low-fat or nonfat yogurt. Low-fat, low-sodium cheese. Fats and oils Soft margarine without trans fats. Vegetable oil. Reduced-fat, low-fat, or light mayonnaise and salad dressings (reduced-sodium). Canola, safflower, olive, avocado, soybean, and sunflower oils. Avocado. Seasonings and condiments Herbs. Spices. Seasoning mixes without  salt. Other foods Unsalted popcorn and pretzels. Fat-free sweets. The items listed above may not be a complete list of foods and beverages you can eat. Contact a dietitian for more information. What foods should I avoid? Fruits Canned fruit in a light or heavy syrup. Fried fruit. Fruit in cream or butter sauce. Vegetables Creamed or fried vegetables. Vegetables in a cheese sauce. Regular canned vegetables (not low-sodium or reduced-sodium). Regular canned tomato sauce and paste (not low-sodium or reduced-sodium). Regular tomato and vegetable juice (not low-sodium or reduced-sodium). Angie Fava. Olives. Grains Baked goods made with fat, such as croissants, muffins, or some breads. Dry pasta or rice meal packs. Meats and other proteins Fatty cuts of meat. Ribs. Fried meat. Berniece Salines. Bologna, salami, and other precooked or cured meats, such as sausages or meat loaves. Fat from the back of a pig (fatback). Bratwurst. Salted nuts and seeds. Canned beans with added salt. Canned or smoked fish. Whole eggs or egg yolks. Chicken or [place] with skin. Dairy Whole or 2%  milk, cream, and half-and-half. Whole or full-fat cream cheese. Whole-fat or sweetened yogurt. Full-fat cheese. Nondairy creamers. Whipped toppings. Processed cheese and cheese spreads. Fats and oils Butter. Stick margarine. Lard. Shortening. Ghee. Bacon fat. Tropical oils, such as coconut, palm kernel, or palm oil. Seasonings and condiments Onion salt, garlic salt, seasoned salt, table salt, and sea salt. Worcestershire sauce. Tartar sauce. Barbecue sauce. Teriyaki sauce. Soy sauce, including reduced-sodium. Steak sauce. Canned and packaged gravies. Fish sauce. Oyster sauce. Cocktail sauce. Store-bought horseradish. Ketchup. Mustard. Meat flavorings and tenderizers. Bouillon cubes. Hot sauces. Pre-made or packaged marinades. Pre-made or packaged taco seasonings. Relishes. Regular salad dressings. Other foods Salted popcorn and pretzels. The items  listed above may not be a complete list of foods and beverages you should avoid. Contact a dietitian for more information. Where to find more information  National Heart, Lung, and Blood Institute: https://wilson-eaton.com/  American Heart Association: www.heart.org  Academy of Nutrition and Dietetics: www.eatright.[place]: www.kidney.org Summary  The DASH eating plan is a healthy eating plan that has been shown to reduce high blood pressure (hypertension). It may also reduce your risk for type 2 diabetes, heart disease, and stroke.  When on the DASH eating plan, aim to eat more fresh fruits and vegetables, whole grains, lean proteins, low-fat dairy, and heart-healthy fats.  With the DASH eating plan, you should limit salt (sodium) intake to 2,300 mg a day. If you have hypertension, you may need to reduce your sodium intake to 1,500 mg a day.  Work with your health care provider or dietitian to adjust your eating plan to your individual calorie needs. This information is not intended to replace advice given to you by your health care provider. Make sure you discuss any questions you have with your health care provider. Document Revised: 03/11/2019 Document Reviewed: 03/11/2019 Elsevier Patient Education  2021 [place] Maintenance, Male Adopting a healthy lifestyle and getting preventive care are important in promoting health and wellness. Ask your health care provider about:  The right schedule for you to have regular tests and exams.  Things you can do on your own to prevent diseases and keep yourself healthy. What should I know about diet, weight, and exercise? Eat a healthy diet  Eat a diet that includes plenty of vegetables, fruits, low-fat dairy products, and lean protein.  Do not eat a lot of foods that are high in solid fats, added sugars, or sodium.   Maintain a healthy weight Body mass index (BMI) is a measurement that can be used to  identify possible weight problems. It estimates body fat based on height and weight. Your health care provider can help determine your BMI and help you achieve or maintain a healthy weight. Get regular exercise Get regular exercise. This is one of the most important things you can do for your health. Most adults should:  Exercise for at least 150 minutes each week. The exercise should increase your heart rate and make you sweat (moderate-intensity exercise).  Do strengthening exercises at least twice a week. This is in addition to the moderate-intensity exercise.  Spend less time sitting. Even light physical activity can be beneficial. Watch cholesterol and blood lipids Have your blood tested for lipids and cholesterol at 61 years of age, then have this test every 5 years. You may need to have your cholesterol levels checked more often if:  Your lipid or cholesterol levels are high.  You are older than 61 years  of age.  You are at high risk for heart disease. What should I know about cancer screening? Many types of cancers can be detected early and may often be prevented. Depending on your health history and family history, you may need to have cancer screening at various ages. This may include screening for:  Colorectal cancer.  Prostate cancer.  Skin cancer.  Lung cancer. What should I know about heart disease, diabetes, and high blood pressure? Blood pressure and heart disease  High blood pressure causes heart disease and increases the risk of stroke. This is more likely to develop in people who have high blood pressure readings, are of African descent, or are overweight.  Talk with your health care provider about your target blood pressure readings.  Have your blood pressure checked: ? Every 3-5 years if you are 56-76 years of age. ? Every year if you are 35 years old or older.  If you are between the ages of 64 and 21 and are a current or former smoker, ask your health  care provider if you should have a one-time screening for abdominal aortic aneurysm (AAA). Diabetes Have regular diabetes screenings. This checks your fasting blood sugar level. Have the screening done:  Once every three years after age 56 if you are at a normal weight and have a low risk for diabetes.  More often and at a younger age if you are overweight or have a high risk for diabetes. What should I know about preventing infection? Hepatitis B If you have a higher risk for hepatitis B, you should be screened for this virus. Talk with your health care provider to find out if you are at risk for hepatitis B infection. Hepatitis C Blood testing is recommended for:  Everyone born from [birth year] through 1966.  Anyone with known risk factors for hepatitis C. Sexually transmitted infections (STIs)  You should be screened each year for STIs, including gonorrhea and chlamydia, if: ? You are sexually active and are younger than 61 years of age. ? You are older than 61 years of age and your health care provider tells you that you are at risk for this type of infection. ? Your sexual activity has changed since you were last screened, and you are at increased risk for chlamydia or gonorrhea. Ask your health care provider if you are at risk.  Ask your health care provider about whether you are at high risk for HIV. Your health care provider may recommend a prescription medicine to help prevent HIV infection. If you choose to take medicine to prevent HIV, you should first get tested for HIV. You should then be tested every 3 months for as long as you are taking the medicine. Follow these instructions at home: Lifestyle  Do not use any products that contain nicotine or tobacco, such as cigarettes, e-cigarettes, and chewing tobacco. If you need help quitting, ask your health care provider.  Do not use street drugs.  Do not share needles.  Ask your health care provider for help if you need support or  information about quitting drugs. Alcohol use  Do not drink alcohol if your health care provider tells you not to drink.  If you drink alcohol: ? Limit how much you have to 0-2 drinks a day. ? Be aware of how much alcohol is in your drink. In the [place] one drink equals one 12 oz bottle of beer (355 mL), one 5 oz glass of wine (148 mL), or one 1 oz  glass of hard liquor (44 mL). General instructions  Schedule regular health, dental, and eye exams.  Stay current with your vaccines.  Tell your health care provider if: ? You often feel depressed. ? You have ever been abused or do not feel safe at home. Summary  Adopting a healthy lifestyle and getting preventive care are important in promoting health and wellness.  Follow your health care provider's instructions about healthy diet, exercising, and getting tested or screened for diseases.  Follow your health care provider's instructions on monitoring your cholesterol and blood pressure. This information is not intended to replace advice given to you by your health care provider. Make sure you discuss any questions you have with your health care provider. Document Revised: 03/31/2018 Document Reviewed: 03/31/2018 Elsevier Patient Education  2021 [employer].

## 2020-07-16 NOTE — Progress Notes (Signed)
Chief Complaint  Patient presents with  . Annual Exam    HPI: Patient  Paul Yoder  60 y.o. comes in today for Paul Yoder visit last visit was for a sore throat prepandemic. He comes in for a physical and follow-up Blood pressure has not really checked it recently except when had eye procedure it was elevated.  In the past he was on lisinopril but says it was stopped or he stopped it because it was not "doing anything". He denies any kind of cardiovascular symptoms with his chest pain shortness of breath palpitations syncope.  States only time his heart rate may be upper racing is after he has had some caffeine.  These check a couple bumps in the perianal area has been there for a couple years Never followed up with colon cancer screening is ready for colonoscopy now. Has gained some weight over the last year or 2.  Has just started eating healthier and may be lost a couple pounds. Has seen dermatology recently has a couple areas on his face using topicals.  Health Maintenance  Topic Date Due  . Hepatitis C Screening  Never done  . COVID-19 Vaccine (1) Never done  . HIV Screening  Never done  . COLONOSCOPY (Pts 45-51yrs Insurance coverage will need to be confirmed)  Never done  . INFLUENZA VACCINE  11/20/2019  . TETANUS/TDAP  07/17/2030  . HPV VACCINES  Aged Out   Health Maintenance Review LIFESTYLE:  Exercise:  Not a lot of cardio  Knee injury  Effected walking  For a while  Working on it.  Tobacco/ETS:n Alcohol: n Sugar beverages:   ocass  Some OJ  Sleep:  8.5  Drug use: no HH of  2 no pets  Work: m- f 8-  About 45 hours.   ROS:  Nocturia   1 .  GEN/ HEENT: No fever, significant weight changes sweats headaches vision problems hearing changes, CV/ PULM; No chest pain shortness of breath cough, syncope,edema  change in exercise tolerance. GI /GU: No adominal pain, vomiting, change in bowel habits. No blood in the stool. No significant GU  symptoms. SKIN/HEME: ,no acute skin rashes suspicious lesions or bleeding. No lymphadenopathy, nodules, masses.  NEURO/ PSYCH:  No neurologic signs such as weakness numbness. No depression anxiety. IMM/ Allergy: No unusual infections.  Allergy .   REST of 12 system review negative except as per HPI   Past Medical History:  Diagnosis Date  . Allergy    seasonal  . Chest wall pain    ED eval HP had resp depression with morphine IV>  . Heart murmur    HCM mild neg ett 2011  . Hypertension     Past Surgical History:  Procedure Laterality Date  . MOLE REMOVAL    . OTHER SURGICAL HISTORY     finger cyst removal    Family History  Problem Relation Age of Onset  . Diabetes Mother   . Hypertension Father   . Hypertension Brother   . ADD / ADHD Son   . Anxiety disorder Son     Social History   Socioeconomic History  . Marital status: Married    Spouse name: Not on file  . Number of children: Not on file  . Years of education: Not on file  . Highest education level: Not on file  Occupational History  . Not on file  Tobacco Use  . Smoking status: Never Smoker  . Smokeless tobacco: Never Used  Substance  and Sexual Activity  . Alcohol use: Yes  . Drug use: Not on file  . Sexual activity: Not on file  Other Topics Concern  . Not on file  Social History Narrative   HHof 3   Son pet dog    worksite Designer, multimedia   Married BS    Active in scouting   Social Determinants of Health   Financial Resource Strain: Not on file  Food Insecurity: Not on file  Transportation Needs: Not on file  Physical Activity: Not on file  Stress: Not on file  Social Connections: Not on file    Outpatient Medications Prior to Visit  Medication Sig Dispense Refill  . prednisoLONE acetate (PRED FORTE) 1 % ophthalmic suspension Place 1 drop into the left eye 4 (four) times daily.     No facility-administered medications prior to visit.     EXAM:  BP (!) 150/102 (BP Location: Left Arm,  Patient Position: Sitting, Cuff Size: Large)   Pulse 67   Temp 98.2 F (36.8 C) (Oral)   Ht 5' 11.5" (1.816 m)   Wt 220 lb 3.2 oz (99.9 kg)   SpO2 96%   BMI 30.28 kg/m   Body mass index is 30.28 kg/m. Wt Readings from Last 3 Encounters:  07/16/20 220 lb 3.2 oz (99.9 kg)  05/19/18 223 lb 4.8 oz (101.3 kg)  03/20/15 222 lb 6.4 oz (100.9 kg)   Repeat blood pressure right arm sitting large 178/100.  Physical Exam: Vital signs reviewed BJY:NWGN is a well-developed well-nourished alert cooperative    who appearsr stated age in no acute distress.  HEENT: normocephalic atraumatic , Eyes: PERRL EOM's full, conjunctiva clear, Nares: paten,t no deformity discharge or tenderness., Ears: no deformity EAC's clear TMs with normal landmarks. Mouth:masked NECK: supple without masses, thyromegaly or bruits. CHEST/PULM:  Clear to auscultation and percussion breath sounds equal no wheeze , rales or rhonchi. No chest wall deformities or tenderness. CV: PMI is nondisplaced, S1 S2 no gallops, murmurs, rubs. Peripheral pulses are full without delay.No JVD .  I do not hear murmur Selva maneuver not done ABDOMEN: Bowel sounds normal nontender  No guard or rebound, no hepato splenomegal no CVA tenderness.   Extremtities:  No clubbing cyanosis or edema, no acute joint swelling or redness no focal atrophy NEURO:  Oriented x3, cranial nerves 3-12 appear to be intact, no obvious focal weakness,gait within normal limits no abnormal reflexes or asymmetrical SKIN: No acute rashes normal turgor, color, no bruising or petechiae.  Some hyperpigmentation from old trauma anterior shin. Perianal area shows some hemorrhoidal tags probable not all are soft.  No warts PSYCH: Oriented, good eye contact, no obvious depression anxiety, cognition and judgment appear normal. LN: no cervical axillary inguinal adenopathy  Lab Results  Component Value Date   WBC 6.2 07/16/2020   HGB 15.2 07/16/2020   HCT 44.7 07/16/2020   PLT  205.0 07/16/2020   GLUCOSE 91 07/16/2020   CHOL 182 07/16/2020   TRIG 105.0 07/16/2020   HDL 41.10 07/16/2020   LDLCALC 120 (H) 07/16/2020   ALT 31 07/16/2020   AST 20 07/16/2020   NA 140 07/16/2020   K 4.6 07/16/2020   CL 102 07/16/2020   CREATININE 1.10 07/16/2020   BUN 14 07/16/2020   CO2 31 07/16/2020   TSH 0.98 07/16/2020   PSA 3.86 07/16/2020   HGBA1C 5.7 07/16/2020  He is fasting today  BP Readings from Last 3 Encounters:  07/16/20 (!) 150/102  05/19/18 132/88  03/20/15 126/74   echoi 2014 [facility] Coup  Stress test  ? No documented LVOFobs? But   Lab plan  reviewed with patient  EKG today shows sinus rhythm no acute findings. ASSESSMENT AND PLAN:  Discussed the following assessment and plan:    ICD-10-CM   1. Visit for preventive health examination  G40.10 Basic metabolic panel    CBC with Differential/Platelet    Hepatic function panel    Lipid panel    TSH    Hemoglobin A1c    Hemoglobin A1c    TSH    Lipid panel    Hepatic function panel    CBC with Differential/Platelet    Basic metabolic panel  2. Essential hypertension  U72 Basic metabolic panel    CBC with Differential/Platelet    Hepatic function panel    Lipid panel    TSH    Hemoglobin A1c    EKG 12-Lead    Hemoglobin A1c    TSH    Lipid panel    Hepatic function panel    CBC with Differential/Platelet    Basic metabolic panel    ECHOCARDIOGRAM COMPLETE   stage 2 readings  ? how much is WC effect. basically not on meds for a few years.   3. Hypertrophic cardiomyopathy ([place]) ?  Z36.6 Basic metabolic panel    CBC with Differential/Platelet    Hepatic function panel    Lipid panel    TSH    Hemoglobin A1c    Hemoglobin A1c    TSH    Lipid panel    Hepatic function panel    CBC with Differential/Platelet    Basic metabolic panel    ECHOCARDIOGRAM COMPLETE   no recent evaluation  apparently no sx    4. Hemorrhoidal skin tags ?  Y40.3 Basic metabolic panel    CBC with  Differential/Platelet    Hepatic function panel    Lipid panel    TSH    Hemoglobin A1c    Ambulatory referral to Gastroenterology    Hemoglobin A1c    TSH    Lipid panel    Hepatic function panel    CBC with Differential/Platelet    Basic metabolic panel  5. Colon cancer screening  Z12.11 Ambulatory referral to Gastroenterology  6. Screening PSA (prostate specific antigen)  Z12.5 PSA    PSA  7. Need for Td vaccine  Z23 Td vaccine preservative free greater than or equal to 7yo IM  8. Medication management  Z79.899     Over due for labs and monitoring Concerned about his elevated blood pressure even under the circumstances. Review of his old echo showed "severe LVH" with question of a ASH.  But no documented LV outflow obstruction. He denies any symptoms but uncertain what his activity level he is this. Review record consider follow-up echo question advisability of MRI as indicated if cardiology feels needed. For size the importance of hypertension control. Prescribed combination medicine to take a half once a day to begin to avoid hypotension.  And stay hydrated.  Avoiding diuretic at this time. Not sure b blocker would be enough to   Control bp at this level  Check blood pressure readings and send them in by MyChart and we can follow-up. Refer for colonoscopy as discussed. Td updated today.  Return for bp reasdings baseline and virtual or in person in 1 month for eval and, depending on results. Patient Care Team: Burnis Medin, MD as PCP - General  Minus Breeding, MD (Cardiology) Patient Instructions   Will refer for colonoscopy.   BP needs control    bp goal below 130/80  Healthy weigh loss .  Dash eating Please bring your blood pressure cuff to next appointment Take blood pressure readings twice a day for5-7 days and record .     Take 2 -3 readings at each sitting .   Can send in readings  by My Chart.    Before checking your blood pressure make sure: You are  seated and quite for 5 min before checking Feet are flat on the floor Siting in chair with your back supported straight up and down Arm resting on table or arm of chair at heart level Bladder is empty You have NOT had caffeine or tobacco within the last 30 min  [email or web address]  Plan ROV or fu  virtual ok in  1 month  May need updated  Echo cardiology eval depending.  Will do record review   begn  bp medication ( dual  med)   If bp too low  Then split in 1/2      [email or web address]">  DASH Eating Plan DASH stands for Dietary Approaches to Stop Hypertension. The DASH eating plan is a healthy eating plan that has been shown to:  Reduce high blood pressure (hypertension).  Reduce your risk for type 2 diabetes, heart disease, and stroke.  Help with weight loss. What are tips for following this plan? Reading food labels  Check food labels for the amount of salt (sodium) per serving. Choose foods with less than 5 percent of the Daily Value of sodium. Generally, foods with less than 300 milligrams (mg) of sodium per serving fit into this eating plan.  To find whole grains, look for the word "whole" as the first word in the ingredient list. Shopping  Buy products labeled as "low-sodium" or "no salt added."  Buy fresh foods. Avoid canned foods and pre-made or frozen meals. Cooking  Avoid adding salt when cooking. Use salt-free seasonings or herbs instead of table salt or sea salt. Check with your health care provider or pharmacist before using salt substitutes.  Do not fry foods. Cook foods using healthy methods such as baking, boiling, grilling, roasting, and broiling instead.  Cook with heart-healthy oils, such as olive, canola, avocado, soybean, or sunflower oil. Meal planning  Eat a balanced diet that includes: ? 4 or more servings of fruits and 4 or more servings of vegetables each day. Try to fill one-half of your plate with  fruits and vegetables. ? 6-8 servings of whole grains each day. ? Less than 6 oz (170 g) of lean meat, poultry, or fish each day. A 3-oz (85-g) serving of meat is about the same size as a deck of cards. One egg equals 1 oz (28 g). ? 2-3 servings of low-fat dairy each day. One serving is 1 cup (237 mL). ? 1 serving of nuts, seeds, or beans 5 times each week. ? 2-3 servings of heart-healthy fats. Healthy fats called omega-3 fatty acids are found in foods such as walnuts, flaxseeds, fortified milks, and eggs. These fats are also found in cold-water fish, such as sardines, salmon, and mackerel.  Limit how much you eat of: ? Canned or prepackaged foods. ? Food that is high in trans fat, such as some fried foods. ? Food that is high in saturated fat, such as fatty meat. ? Desserts and other sweets, sugary drinks, and other foods with added sugar. ? Full-fat  dairy products.  Do not salt foods before eating.  Do not eat more than 4 egg yolks a week.  Try to eat at least 2 vegetarian meals a week.  Eat more home-cooked food and less restaurant, buffet, and fast food.   Lifestyle  When eating at a restaurant, ask that your food be prepared with less salt or no salt, if possible.  If you drink alcohol: ? Limit how much you use to:  0-1 drink a day for women who are not pregnant.  0-2 drinks a day for men. ? Be aware of how much alcohol is in your drink. In the [place] one drink equals one 12 oz bottle of beer (355 mL), one 5 oz glass of wine (148 mL), or one 1 oz glass of hard liquor (44 mL). General information  Avoid eating more than 2,300 mg of salt a day. If you have hypertension, you may need to reduce your sodium intake to 1,500 mg a day.  Work with your health care provider to maintain a healthy body weight or to lose weight. Ask what an ideal weight is for you.  Get at least 30 minutes of exercise that causes your heart to beat faster (aerobic exercise) most days of the week.  Activities may include walking, swimming, or biking.  Work with your health care provider or dietitian to adjust your eating plan to your individual calorie needs. What foods should I eat? Fruits All fresh, dried, or frozen fruit. Canned fruit in natural juice (without added sugar). Vegetables Fresh or frozen vegetables (raw, steamed, roasted, or grilled). Low-sodium or reduced-sodium tomato and vegetable juice. Low-sodium or reduced-sodium tomato sauce and tomato paste. Low-sodium or reduced-sodium canned vegetables. Grains Whole-grain or whole-wheat bread. Whole-grain or whole-wheat pasta. Brown rice. [place]. Bulgur. Whole-grain and low-sodium cereals. Pita bread. Low-fat, low-sodium crackers. Whole-wheat flour tortillas. Meats and other proteins Skinless chicken or [place]. Ground chicken or [place]. Pork with fat trimmed off. Fish and seafood. Egg whites. Dried beans, peas, or lentils. Unsalted nuts, nut butters, and seeds. Unsalted canned beans. Lean cuts of beef with fat trimmed off. Low-sodium, lean precooked or cured meat, such as sausages or meat loaves. Dairy Low-fat (1%) or fat-free (skim) milk. Reduced-fat, low-fat, or fat-free cheeses. Nonfat, low-sodium ricotta or cottage cheese. Low-fat or nonfat yogurt. Low-fat, low-sodium cheese. Fats and oils Soft margarine without trans fats. Vegetable oil. Reduced-fat, low-fat, or light mayonnaise and salad dressings (reduced-sodium). Canola, safflower, olive, avocado, soybean, and sunflower oils. Avocado. Seasonings and condiments Herbs. Spices. Seasoning mixes without salt. Other foods Unsalted popcorn and pretzels. Fat-free sweets. The items listed above may not be a complete list of foods and beverages you can eat. Contact a dietitian for more information. What foods should I avoid? Fruits Canned fruit in a light or heavy syrup. Fried fruit. Fruit in cream or butter sauce. Vegetables Creamed or fried vegetables. Vegetables in a  cheese sauce. Regular canned vegetables (not low-sodium or reduced-sodium). Regular canned tomato sauce and paste (not low-sodium or reduced-sodium). Regular tomato and vegetable juice (not low-sodium or reduced-sodium). Angie Fava. Olives. Grains Baked goods made with fat, such as croissants, muffins, or some breads. Dry pasta or rice meal packs. Meats and other proteins Fatty cuts of meat. Ribs. Fried meat. Berniece Salines. Bologna, salami, and other precooked or cured meats, such as sausages or meat loaves. Fat from the back of a pig (fatback). Bratwurst. Salted nuts and seeds. Canned beans with added salt. Canned or smoked fish. Whole eggs or egg  yolks. Chicken or [place] with skin. Dairy Whole or 2% milk, cream, and half-and-half. Whole or full-fat cream cheese. Whole-fat or sweetened yogurt. Full-fat cheese. Nondairy creamers. Whipped toppings. Processed cheese and cheese spreads. Fats and oils Butter. Stick margarine. Lard. Shortening. Ghee. Bacon fat. Tropical oils, such as coconut, palm kernel, or palm oil. Seasonings and condiments Onion salt, garlic salt, seasoned salt, table salt, and sea salt. Worcestershire sauce. Tartar sauce. Barbecue sauce. Teriyaki sauce. Soy sauce, including reduced-sodium. Steak sauce. Canned and packaged gravies. Fish sauce. Oyster sauce. Cocktail sauce. Store-bought horseradish. Ketchup. Mustard. Meat flavorings and tenderizers. Bouillon cubes. Hot sauces. Pre-made or packaged marinades. Pre-made or packaged taco seasonings. Relishes. Regular salad dressings. Other foods Salted popcorn and pretzels. The items listed above may not be a complete list of foods and beverages you should avoid. Contact a dietitian for more information. Where to find more information  National Heart, Lung, and Blood Institute: https://wilson-eaton.com/  American Heart Association: www.heart.org  Academy of Nutrition and Dietetics: www.eatright.[place]:  www.kidney.org Summary  The DASH eating plan is a healthy eating plan that has been shown to reduce high blood pressure (hypertension). It may also reduce your risk for type 2 diabetes, heart disease, and stroke.  When on the DASH eating plan, aim to eat more fresh fruits and vegetables, whole grains, lean proteins, low-fat dairy, and heart-healthy fats.  With the DASH eating plan, you should limit salt (sodium) intake to 2,300 mg a day. If you have hypertension, you may need to reduce your sodium intake to 1,500 mg a day.  Work with your health care provider or dietitian to adjust your eating plan to your individual calorie needs. This information is not intended to replace advice given to you by your health care provider. Make sure you discuss any questions you have with your health care provider. Document Revised: 03/11/2019 Document Reviewed: 03/11/2019 Elsevier Patient Education  2021 [place] Maintenance, Male Adopting a healthy lifestyle and getting preventive care are important in promoting health and wellness. Ask your health care provider about:  The right schedule for you to have regular tests and exams.  Things you can do on your own to prevent diseases and keep yourself healthy. What should I know about diet, weight, and exercise? Eat a healthy diet  Eat a diet that includes plenty of vegetables, fruits, low-fat dairy products, and lean protein.  Do not eat a lot of foods that are high in solid fats, added sugars, or sodium.   Maintain a healthy weight Body mass index (BMI) is a measurement that can be used to identify possible weight problems. It estimates body fat based on height and weight. Your health care provider can help determine your BMI and help you achieve or maintain a healthy weight. Get regular exercise Get regular exercise. This is one of the most important things you can do for your health. Most adults should:  Exercise for at least 150  minutes each week. The exercise should increase your heart rate and make you sweat (moderate-intensity exercise).  Do strengthening exercises at least twice a week. This is in addition to the moderate-intensity exercise.  Spend less time sitting. Even light physical activity can be beneficial. Watch cholesterol and blood lipids Have your blood tested for lipids and cholesterol at 61 years of age, then have this test every 5 years. You may need to have your cholesterol levels checked more often if:  Your lipid or cholesterol  levels are high.  You are older than 61 years of age.  You are at high risk for heart disease. What should I know about cancer screening? Many types of cancers can be detected early and may often be prevented. Depending on your health history and family history, you may need to have cancer screening at various ages. This may include screening for:  Colorectal cancer.  Prostate cancer.  Skin cancer.  Lung cancer. What should I know about heart disease, diabetes, and high blood pressure? Blood pressure and heart disease  High blood pressure causes heart disease and increases the risk of stroke. This is more likely to develop in people who have high blood pressure readings, are of African descent, or are overweight.  Talk with your health care provider about your target blood pressure readings.  Have your blood pressure checked: ? Every 3-5 years if you are 72-55 years of age. ? Every year if you are 59 years old or older.  If you are between the ages of 22 and 75 and are a current or former smoker, ask your health care provider if you should have a one-time screening for abdominal aortic aneurysm (AAA). Diabetes Have regular diabetes screenings. This checks your fasting blood sugar level. Have the screening done:  Once every three years after age 7 if you are at a normal weight and have a low risk for diabetes.  More often and at a younger age if you are  overweight or have a high risk for diabetes. What should I know about preventing infection? Hepatitis B If you have a higher risk for hepatitis B, you should be screened for this virus. Talk with your health care provider to find out if you are at risk for hepatitis B infection. Hepatitis C Blood testing is recommended for:  Everyone born from [birth year] through 1966.  Anyone with known risk factors for hepatitis C. Sexually transmitted infections (STIs)  You should be screened each year for STIs, including gonorrhea and chlamydia, if: ? You are sexually active and are younger than 61 years of age. ? You are older than 61 years of age and your health care provider tells you that you are at risk for this type of infection. ? Your sexual activity has changed since you were last screened, and you are at increased risk for chlamydia or gonorrhea. Ask your health care provider if you are at risk.  Ask your health care provider about whether you are at high risk for HIV. Your health care provider may recommend a prescription medicine to help prevent HIV infection. If you choose to take medicine to prevent HIV, you should first get tested for HIV. You should then be tested every 3 months for as long as you are taking the medicine. Follow these instructions at home: Lifestyle  Do not use any products that contain nicotine or tobacco, such as cigarettes, e-cigarettes, and chewing tobacco. If you need help quitting, ask your health care provider.  Do not use street drugs.  Do not share needles.  Ask your health care provider for help if you need support or information about quitting drugs. Alcohol use  Do not drink alcohol if your health care provider tells you not to drink.  If you drink alcohol: ? Limit how much you have to 0-2 drinks a day. ? Be aware of how much alcohol is in your drink. In the [place] one drink equals one 12 oz bottle of beer (355 mL), one 5 oz  glass of wine (148 mL), or one 1  oz glass of hard liquor (44 mL). General instructions  Schedule regular health, dental, and eye exams.  Stay current with your vaccines.  Tell your health care provider if: ? You often feel depressed. ? You have ever been abused or do not feel safe at home. Summary  Adopting a healthy lifestyle and getting preventive care are important in promoting health and wellness.  Follow your health care provider's instructions about healthy diet, exercising, and getting tested or screened for diseases.  Follow your health care provider's instructions on monitoring your cholesterol and blood pressure. This information is not intended to replace advice given to you by your health care provider. Make sure you discuss any questions you have with your health care provider. Document Revised: 03/31/2018 Document Reviewed: 03/31/2018 Elsevier Patient Education  2021 [place] K.  M.D.

## 2020-07-17 LAB — HEPATIC FUNCTION PANEL
ALT: 31 U/L (ref 0–53)
AST: 20 U/L (ref 0–37)
Albumin: 4.5 g/dL (ref 3.5–5.2)
Alkaline Phosphatase: 44 U/L (ref 39–117)
Bilirubin, Direct: 0.1 mg/dL (ref 0.0–0.3)
Total Bilirubin: 0.7 mg/dL (ref 0.2–1.2)
Total Protein: 7.1 g/dL (ref 6.0–8.3)

## 2020-07-17 LAB — CBC WITH DIFFERENTIAL/PLATELET
Basophils Absolute: 0.1 10*3/uL (ref 0.0–0.1)
Basophils Relative: 1 % (ref 0.0–3.0)
Eosinophils Absolute: 0.2 10*3/uL (ref 0.0–0.7)
Eosinophils Relative: 2.5 % (ref 0.0–5.0)
HCT: 44.7 % (ref 39.0–52.0)
Hemoglobin: 15.2 g/dL (ref 13.0–17.0)
Lymphocytes Relative: 27.1 % (ref 12.0–46.0)
Lymphs Abs: 1.7 10*3/uL (ref 0.7–4.0)
MCHC: 34.1 g/dL (ref 30.0–36.0)
MCV: 88 fl (ref 78.0–100.0)
Monocytes Absolute: 0.6 10*3/uL (ref 0.1–1.0)
Monocytes Relative: 9.3 % (ref 3.0–12.0)
Neutro Abs: 3.7 10*3/uL (ref 1.4–7.7)
Neutrophils Relative %: 60.1 % (ref 43.0–77.0)
Platelets: 205 10*3/uL (ref 150.0–400.0)
RBC: 5.09 Mil/uL (ref 4.22–5.81)
RDW: 13.3 % (ref 11.5–15.5)
WBC: 6.2 10*3/uL (ref 4.0–10.5)

## 2020-07-17 LAB — PSA: PSA: 3.86 ng/mL (ref 0.10–4.00)

## 2020-07-17 LAB — BASIC METABOLIC PANEL
BUN: 14 mg/dL (ref 6–23)
CO2: 31 mEq/L (ref 19–32)
Calcium: 9.7 mg/dL (ref 8.4–10.5)
Chloride: 102 mEq/L (ref 96–112)
Creatinine, Ser: 1.1 mg/dL (ref 0.40–1.50)
GFR: 72.83 mL/min (ref >60.00)
Glucose, Bld: 91 mg/dL (ref 70–99)
Potassium: 4.6 mEq/L (ref 3.5–5.1)
Sodium: 140 mEq/L (ref 135–145)

## 2020-07-17 LAB — LIPID PANEL
Cholesterol: 182 mg/dL (ref 0–200)
HDL: 41.1 mg/dL (ref >39.00)
LDL Cholesterol: 120 mg/dL — ABNORMAL HIGH (ref 0–99)
NonHDL: 140.95
Total CHOL/HDL Ratio: 4
Triglycerides: 105 mg/dL (ref 0.0–149.0)
VLDL: 21 mg/dL (ref 0.0–40.0)

## 2020-07-17 LAB — HEMOGLOBIN A1C: Hgb A1c MFr Bld: 5.7 % (ref 4.6–6.5)

## 2020-07-17 LAB — TSH: TSH: 0.98 u[IU]/mL (ref 0.35–4.50)

## 2020-07-19 NOTE — Progress Notes (Signed)
Tell patient  results results   show either in range   or cholesterol slight elevated ldl.    On record review  I advise and ordered a repeat  fu echocardiogram.   You will be contacted about this .  Also maker sure we hav follow up  about BP control  . Encourage to sign up for my chart to help communicate BP readings  and  plan.

## 2020-08-16 ENCOUNTER — Telehealth (INDEPENDENT_AMBULATORY_CARE_PROVIDER_SITE_OTHER): Payer: BC Managed Care – PPO | Admitting: Internal Medicine

## 2020-08-16 ENCOUNTER — Encounter: Payer: Self-pay | Admitting: Internal Medicine

## 2020-08-16 DIAGNOSIS — Z79899 Other long term (current) drug therapy: Secondary | ICD-10-CM | POA: Diagnosis not present

## 2020-08-16 DIAGNOSIS — I422 Other hypertrophic cardiomyopathy: Secondary | ICD-10-CM

## 2020-08-16 DIAGNOSIS — I10 Essential (primary) hypertension: Secondary | ICD-10-CM

## 2020-08-16 NOTE — Telephone Encounter (Signed)
See  Virtual visit from today readings coming down to goal . To continue medication  And monitor  Intermittently  Plan   Fu in about 4 months  Or as needed  Echo  And gi  appt pending

## 2020-08-16 NOTE — Progress Notes (Signed)
Virtual Visit via Video Note  I connected with@ on 08/16/20 at  8:30 AM EDT by a video enabled telemedicine application and verified that I am speaking with the correct person using two identifiers. Location patient: home Location provider:home office Persons participating in the virtual visit: patient, provider  [place]th national recommendations  regarding COVID 19 pandemic   video visit is advised over in office visit for this patient.  Patient aware  of the limitations of evaluation and management by telemedicine and  availability of in person appointments. and agreed to proceed.   HPI: Paul Yoder presents for video visit  Fu  BP control See visit  3 22 PV   He began new medicine valsartan amlodipine without obvious side effect except muscles feel tired intermittently.  No syncope chest pain dizziness shortness of breath.  In the last week or so improvement in blood pressure readings 120-138/80-84 range he did send in spreadsheet of readings.  Cardiology for echocardiogram has reached out to him but no specific appointment yet.  He has not heard from the GI department about screening colonoscopy and hemorrhoidal problem.    Given     ROS: See pertinent positives and negatives per HPI.  Past Medical History:  Diagnosis Date  . Allergy    seasonal  . Chest wall pain    ED eval [facility] had resp depression with morphine IV>  . Heart murmur    HCM mild neg ett 2011  . Hypertension     Past Surgical History:  Procedure Laterality Date  . MOLE REMOVAL    . OTHER SURGICAL HISTORY     finger cyst removal    Family History  Problem Relation Age of Onset  . Diabetes Mother   . Hypertension Father   . Hypertension Brother   . ADD / ADHD Son   . Anxiety disorder Son     Social History   Tobacco Use  . Smoking status: Never Smoker  . Smokeless tobacco: Never Used  Substance Use Topics  . Alcohol use: Yes      Current Outpatient Medications:  .   amLODipine-valsartan (EXFORGE) 5-160 MG tablet, Take 1 tablet by mouth daily. For high BP, Disp: 30 tablet, Rfl: 3 .  prednisoLONE acetate (PRED FORTE) 1 % ophthalmic suspension, Place 1 drop into the left eye 4 (four) times daily., Disp: , Rfl:   EXAM: BP Readings from Last 3 Encounters:  07/16/20 (!) 150/102  05/19/18 132/88  03/20/15 126/74    VITALS per patient if applicable:  GENERAL: alert, oriented, appears well and in no acute distress  HEENT: atraumatic, conjunttiva clear, no obvious abnormalities on inspection of external nose and ears  NECK: normal movements of the head and neck  LUNGS: on inspection no signs of respiratory distress, breathing rate appears normal, no obvious gross SOB, gasping or wheezing  CV: no obvious cyanosis  MS: moves all visible extremities without noticeable abnormality  PSYCH/NEURO: pleasant and cooperative, no obvious depression or anxiety, speech and thought processing grossly intact Lab Results  Component Value Date   WBC 6.2 07/16/2020   HGB 15.2 07/16/2020   HCT 44.7 07/16/2020   PLT 205.0 07/16/2020   GLUCOSE 91 07/16/2020   CHOL 182 07/16/2020   TRIG 105.0 07/16/2020   HDL 41.10 07/16/2020   LDLCALC 120 (H) 07/16/2020   ALT 31 07/16/2020   AST 20 07/16/2020   NA 140 07/16/2020   K 4.6 07/16/2020   CL 102 07/16/2020   CREATININE 1.10 07/16/2020  BUN 14 07/16/2020   CO2 31 07/16/2020   TSH 0.98 07/16/2020   PSA 3.86 07/16/2020   HGBA1C 5.7 07/16/2020    ASSESSMENT AND PLAN:  Discussed the following assessment and plan:    ICD-10-CM   1. Essential hypertension  I10   2. 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  I42.2   3. Medication management  Z79.899    Reportedly blood pressure much better control echocardiogram pending GI consult pending if no contact from the GI department after another 2 to 3 weeks contact us about referral. Otherwise continue medication intermittent monitoring reviewed blood pressure goals and R  OV about 4 months if doing well virtual okay otherwise in person. Counseled.   Expectant management and discussion of plan and treatment with opportunity to ask questions and all were answered. The patient agreed with the plan and demonstrated an understanding of the instructions.   Advised to call back or seek an in-person evaluation if worsening  or having  further concerns . Return in about 4 months (around 12/16/2020) for inperson or virtual.  Shanon Ace, MD

## 2020-09-05 ENCOUNTER — Ambulatory Visit (HOSPITAL_COMMUNITY)
Admission: RE | Admit: 2020-09-05 | Discharge: 2020-09-05 | Disposition: A | Discharge status: Home / Self Care | Payer: BC Managed Care – PPO | Source: Ambulatory Visit | Attending: Internal Medicine | Admitting: Internal Medicine

## 2020-09-05 ENCOUNTER — Other Ambulatory Visit: Payer: Self-pay

## 2020-09-05 DIAGNOSIS — I422 Other hypertrophic cardiomyopathy: Secondary | ICD-10-CM | POA: Diagnosis not present

## 2020-09-05 DIAGNOSIS — I10 Essential (primary) hypertension: Secondary | ICD-10-CM | POA: Diagnosis present

## 2020-09-05 DIAGNOSIS — I340 Nonrheumatic mitral (valve) insufficiency: Secondary | ICD-10-CM | POA: Insufficient documentation

## 2020-09-05 DIAGNOSIS — I119 Hypertensive heart disease without heart failure: Secondary | ICD-10-CM | POA: Diagnosis not present

## 2020-09-05 LAB — ECHOCARDIOGRAM COMPLETE
Area-P 1/2: 3.46 cm2
P 1/2 time: 528 msec
S' Lateral: 3.1 cm

## 2020-09-17 ENCOUNTER — Encounter: Payer: Self-pay | Admitting: Internal Medicine

## 2020-09-17 DIAGNOSIS — I7789 Other specified disorders of arteries and arterioles: Secondary | ICD-10-CM | POA: Insufficient documentation

## 2020-09-17 NOTE — Progress Notes (Signed)
Echo cardiogram show some  thickening of left ventricle usually associated with high blood pressure  ...not any worse ,best treated by  Bp control as we are doing. Heart chambers are pumping well.  There is a mild  enlargement of the  early root of aorta  which needs to be monitored over time to make sure not enlarging.   I want you to be referred to cardiology again  for follow up plan  for this finding . Your last visit noted in record was 2014  . Please place referral to cardiology for hcm and aortic root dilitation

## 2020-09-18 ENCOUNTER — Telehealth: Payer: Self-pay

## 2020-09-18 DIAGNOSIS — I77810 Thoracic aortic ectasia: Secondary | ICD-10-CM

## 2020-09-18 DIAGNOSIS — I422 Other hypertrophic cardiomyopathy: Secondary | ICD-10-CM

## 2020-09-18 NOTE — Telephone Encounter (Signed)
-----   Message from Burnis Medin, MD sent at 09/17/2020  4:45 PM EDT ----- Please see result note comment and do referral back to cardiology . Not sure if you got this  Result note advice.

## 2020-11-26 ENCOUNTER — Other Ambulatory Visit: Payer: Self-pay | Admitting: Internal Medicine

## 2020-11-29 ENCOUNTER — Encounter: Payer: Self-pay | Admitting: Cardiology

## 2020-11-29 DIAGNOSIS — R931 Abnormal findings on diagnostic imaging of heart and coronary circulation: Secondary | ICD-10-CM | POA: Insufficient documentation

## 2020-11-29 NOTE — Progress Notes (Signed)
Cardiology Office Note   Date:  11/30/2020   ID:  Paul Yoder, DOB [birth year], MRN [id number]  PCP:  Burnis Medin, MD  Cardiologist:   None Referring:  Burnis Medin, MD  No chief complaint on file.     History of Present Illness: Paul Yoder is a 60 y.o. male who presents for evaluation of an abnormal echo.  Echo in May demonstrated an EF of 60 - 65%.  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  There was moderate hypertrophy with mild SAM.  Aortic dilatation was evident at 42 mm.  I saw him back in 2014 for this.  He had evidence of septal hypertrophy at that time.  He returns for follow-up.  He did have some strange sensation about a month ago where he felt "off".  He was tested and did not have COVID.  He had some tightness across his shoulders and the back and down in his lower back.  He felt fatigued.  He had a bowel movement and all of his symptoms went away.  He actually has not been bothered by any of this since then.  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  He might get a little short of breath but this is very mild and only slowly progressive.  He does not have any resting shortness of breath.  He has very rare palpitations.  Is not any presyncope or syncope.  Has had no weight gain or edema.   Past Medical History:  Diagnosis Date   Hypertension     Past Surgical History:  Procedure Laterality Date   EYE SURGERY N/A    MOLE REMOVAL     OTHER SURGICAL HISTORY     finger cyst removal     Current Outpatient Medications  Medication Sig Dispense Refill   amLODipine-valsartan (EXFORGE) 5-160 MG tablet TAKE 1 TABLET BY MOUTH DAILY FOR HIGH BLOOD PRESSURE 30 tablet 3   prednisoLONE acetate (PRED FORTE) 1 % ophthalmic suspension Place 1 drop into the left eye 4 (four) times daily.     No current facility-administered medications for this visit.    Allergies:   Morphine and related    Social History:  The patient  reports that he  has never smoked. He has never used smokeless tobacco. He reports current alcohol use.   Family History:  The patient's family history includes ADD / ADHD in his son; Anxiety disorder in his son; Diabetes in his mother; Hypertension in his brother and father.   There is no family history of sudden cardiac death.   ROS:  Please see the history of present illness.   Otherwise, review of systems are positive for none.   All other systems are reviewed and negative.    PHYSICAL EXAM: VS:  BP (!) 148/84   Pulse 70   Ht '5\' 11"'$  (1.803 m)   Wt 224 lb (101.6 kg)   SpO2 98%   BMI 31.24 kg/m  , BMI Body mass index is 31.24 kg/m. GENERAL:  Well appearing HEENT:  Pupils equal round and reactive, fundi not visualized, oral mucosa unremarkable NECK:  No jugular venous distention, waveform within normal limits, carotid upstroke brisk and symmetric, no bruits, no thyromegaly LYMPHATICS:  No cervical, inguinal adenopathy LUNGS:  Clear to auscultation bilaterally BACK:  No CVA tenderness CHEST:  Unremarkable HEART:  PMI not displaced or sustained,S1 and S2 within normal limits, no S3, no S4, no clicks, no rubs, 3 out of 6  apical systolic murmur radiating slightly at the aortic outflow tract and increasing with the strain phase of Valsalva, no diastolic murmurs ABD:  Flat, positive bowel sounds normal in frequency in pitch, no bruits, no rebound, no guarding, no midline pulsatile mass, no hepatomegaly, no splenomegaly EXT:  2 plus pulses throughout, no edema, no cyanosis no clubbing SKIN:  No rashes no nodules NEURO:  Cranial nerves II through XII grossly intact, motor grossly intact throughout PSYCH:  Cognitively intact, oriented to person place and time  ECHO:   1. 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 2. Mild SAM no significant restin LVOT gradient noted but likely small gradient with valsalva signal seems contaminated by MR . Left ventricular ejection fraction, by estimation, is 60 to 65%. The  left ventricle has normal function. The left ventricle has no regional wall motion abnormalities. There is moderate left ventricular hypertrophy. Left ventricular diastolic parameters were normal. 3. Right ventricular systolic function is normal. The right ventricular size is normal. There is normal pulmonary artery systolic pressure. 4. Left atrial size was mildly dilated. 5. Right atrial size was mildly dilated. 6. The mitral valve is normal in structure. Mild mitral valve regurgitation. No evidence of mitral stenosis. 7. The aortic valve is tricuspid. Aortic valve regurgitation is trivial. Mild to moderate aortic valve sclerosis/calcification is present, without any evidence of aortic stenosis. 8. 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 Aortic dilatation noted. There is mild dilatation of the aortic root, measuring 39 mm. 9. The inferior vena cava is normal in size with greater than 50% respiratory variability, suggesting right atrial pressure of 3 mmHg.  EKG:  EKG is not ordered today. The ekg ordered 07/16/2020 demonstrates sinus rhythm, rate 67, axis within normal limits, intervals within normal limits, no acute ST-T wave changes.   Recent Labs: 07/16/2020: ALT 31; BUN 14; Creatinine, Ser 1.10; Hemoglobin 15.2; Platelets 205.0; Potassium 4.6; Sodium 140; TSH 0.98    Lipid Panel    Component Value Date/Time   CHOL 182 07/16/2020 1508   TRIG 105.0 07/16/2020 1508   HDL 41.10 07/16/2020 1508   CHOLHDL 4 07/16/2020 1508   VLDL 21.0 07/16/2020 1508   LDLCALC 120 (H) 07/16/2020 1508      Wt Readings from Last 3 Encounters:  11/30/20 224 lb (101.6 kg)  07/16/20 220 lb 3.2 oz (99.9 kg)  05/19/18 223 lb 4.8 oz (101.3 kg)      Other studies Reviewed: Additional studies/ records that were reviewed today include: Echocardiogram images reviewed.  This was ordered by his primary provider.. Review of the above records demonstrates:  Please see elsewhere in the note.     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  He does not really have a lot of symptoms related to this.  I am going to screen him with an MRI to look for late gadolinium enhancement.  We will measure septal size.  I am going to set him up for genetic counseling.  We talked about how this would affect his son.  Once I get the MRI I am going to probably order a 3-day event monitor again to screen for high risk features.  Otherwise we would manage this symptomatically and with appropriate counseling.  HTN: His blood pressure is mildly elevated today but he says it is well controlled at home and he will start taking it for a few weeks to monitor it and make sure that it is in target.  Current medicines are reviewed at length  with the patient today.  The patient does not have concerns regarding medicines.  The following changes have been made:  no change  Labs/ tests ordered today include:   Orders Placed This Encounter  Procedures   MR CARDIAC MORPHOLOGY WO CONTRAST   Hemoglobin and hematocrit, blood   Basic metabolic panel   EKG [id number]    Disposition:   FU with me in one year.     Signed, Minus Breeding, MD  11/30/2020 3:34 PM    [facility]

## 2020-11-30 ENCOUNTER — Other Ambulatory Visit: Payer: Self-pay

## 2020-11-30 ENCOUNTER — Ambulatory Visit: Payer: BC Managed Care – PPO | Admitting: Cardiology

## 2020-11-30 ENCOUNTER — Encounter: Payer: Self-pay | Admitting: Cardiology

## 2020-11-30 VITALS — BP 148/84 | HR 70 | Ht 71.0 in | Wt 224.0 lb

## 2020-11-30 DIAGNOSIS — R931 Abnormal findings on diagnostic imaging of heart and coronary circulation: Secondary | ICD-10-CM

## 2020-11-30 DIAGNOSIS — I10 Essential (primary) hypertension: Secondary | ICD-10-CM

## 2020-11-30 DIAGNOSIS — I517 Cardiomegaly: Secondary | ICD-10-CM

## 2020-11-30 NOTE — Addendum Note (Signed)
Addended by: Ricci Barker on: 11/30/2020 04:05 PM   Modules accepted: Orders

## 2020-11-30 NOTE — Patient Instructions (Signed)
Medication Instructions:  No changes *If you need a refill on your cardiac medications before your next appointment, please call your pharmacy*   Lab Work: None ordered If you have labs (blood work) drawn today and your tests are completely normal, you will receive your results only by: [place] (if you have MyChart) OR A paper copy in the mail If you have any lab test that is abnormal or we need to change your treatment, we will call you to review the results.   Testing/Procedures:   You are scheduled for Cardiac MRI on ______________. Please arrive at the [facility] main entrance of [facility] at ________________ (30-45 minutes prior to test start time). ?  [facility] [address] [place],  [address] [phone]  Please take advantage of the free valet parking available at the MAIN entrance (A entrance). Proceed to the [facility] Radiology Department (First Floor). ? Magnetic resonance imaging (MRI) is a painless test that produces images of the inside of the body without using Xrays.  During an MRI, strong magnets and radio waves work together in a Research officer, political party to form detailed images.   MRI images may provide more details about a medical condition than X-rays, CT scans, and ultrasounds can provide.  You may be given earphones to listen for instructions.  You may eat a light breakfast and take medications as ordered with the exception of HCTZ (fluid pill, other). Please avoid stimulants for 12 hr prior to test. (Ie. Caffeine, nicotine, chocolate, or antihistamine medications)  If a contrast material will be used, an IV will be inserted into one of your veins. Contrast material will be injected into your IV. It will leave your body through your urine within a day. You may be told to drink plenty of fluids to help flush the contrast material out of your system.  You will be asked to remove all metal, including: Watch, jewelry, and  other metal objects including hearing aids, hair pieces and dentures. Also wearable glucose monitoring systems (ie. Freestyle Libre and Omnipods) (Braces and fillings normally are not a problem.)   TEST WILL TAKE APPROXIMATELY 1 HOUR  PLEASE NOTIFY SCHEDULING AT LEAST 24 HOURS IN ADVANCE IF YOU ARE UNABLE TO KEEP YOUR APPOINTMENT. [phone]  Please call Marchia Bond, cardiac imaging nurse navigator with any questions/concerns. Marchia Bond RN Navigator Cardiac Imaging Gordy Clement RN Navigator Cardiac Imaging [facility] [phone] Office     Follow-Up: At [facility], you and your health needs are our priority.  As part of our continuing mission to provide you with exceptional heart care, we have created designated Provider Care Teams.  These Care Teams include your primary Cardiologist (physician) and Advanced Practice Providers (APPs -  Physician Assistants and Nurse Practitioners) who all work together to provide you with the care you need, when you need it.  We recommend signing up for the patient portal called "MyChart".  Sign up information is provided on this After Visit Summary.  MyChart is used to connect with patients for Virtual Visits (Telemedicine).  Patients are able to view lab/test results, encounter notes, upcoming appointments, etc.  Non-urgent messages can be sent to your provider as well.   To learn more about what you can do with MyChart, go to [email or web address].    Your next appointment:   12 month(s)  The format for your next appointment:   In Person  Provider:   You may see Dr. Percival Spanish or  one of the following Advanced Practice Providers on your designated Care Team:   Rosaria Ferries, PA-C Caron Presume, PA-C Jory Sims, DNP, ANP   Other Instructions A referral has been made to Dr. Lattie Corns

## 2020-12-03 ENCOUNTER — Other Ambulatory Visit: Payer: Self-pay | Admitting: *Deleted

## 2020-12-03 DIAGNOSIS — I517 Cardiomegaly: Secondary | ICD-10-CM

## 2021-01-14 ENCOUNTER — Ambulatory Visit (HOSPITAL_COMMUNITY): Payer: BC Managed Care – PPO

## 2021-03-07 ENCOUNTER — Other Ambulatory Visit: Payer: Self-pay | Admitting: Internal Medicine

## 2021-03-15 ENCOUNTER — Telehealth (HOSPITAL_COMMUNITY): Payer: Self-pay | Admitting: Emergency Medicine

## 2021-03-15 NOTE — Telephone Encounter (Signed)
Phone rang, no answering service, unable to leave vm [facility] [phone] Office  [phone] Cell

## 2021-03-18 ENCOUNTER — Telehealth (HOSPITAL_COMMUNITY): Payer: Self-pay | Admitting: *Deleted

## 2021-03-18 NOTE — Telephone Encounter (Signed)
Reaching out to patient to offer assistance regarding upcoming cardiac imaging study; pt verbalizes understanding of appt date/time, parking situation and where to check in, and verified current allergies; name and call back number provided for further questions should they arise  Gordy Clement RN [facility] [phone] office [phone] cell  Patient aware to obtain blood work prior to appointment and denies metal or claustrophobia.

## 2021-03-19 ENCOUNTER — Other Ambulatory Visit: Payer: Self-pay

## 2021-03-19 ENCOUNTER — Ambulatory Visit (HOSPITAL_COMMUNITY)
Admission: RE | Admit: 2021-03-19 | Discharge: 2021-03-19 | Disposition: A | Discharge status: Home / Self Care | Payer: BC Managed Care – PPO | Source: Ambulatory Visit | Attending: Cardiology | Admitting: Cardiology

## 2021-03-19 DIAGNOSIS — I517 Cardiomegaly: Secondary | ICD-10-CM | POA: Diagnosis present

## 2021-03-19 LAB — BASIC METABOLIC PANEL
BUN/Creatinine Ratio: 15 (ref 10–24)
BUN: 17 mg/dL (ref 8–27)
CO2: 22 mmol/L (ref 20–29)
Calcium: 9.1 mg/dL (ref 8.6–10.2)
Chloride: 103 mmol/L (ref 96–106)
Creatinine, Ser: 1.12 mg/dL (ref 0.76–1.27)
Glucose: 116 mg/dL — ABNORMAL HIGH (ref 70–99)
Potassium: 4.4 mmol/L (ref 3.5–5.2)
Sodium: 141 mmol/L (ref 134–144)
eGFR: 75 mL/min/{1.73_m2} (ref >59)

## 2021-03-19 LAB — HEMOGLOBIN AND HEMATOCRIT, BLOOD
Hematocrit: 43.6 % (ref 37.5–51.0)
Hemoglobin: 14.7 g/dL (ref 13.0–17.7)

## 2021-03-19 MED ORDER — GADOBUTROL 1 MMOL/ML IV SOLN
10.0000 mL | Freq: Once | INTRAVENOUS | Status: AC | PRN
  Administered 2021-03-19: 10 mL via INTRAVENOUS

## 2021-03-25 ENCOUNTER — Other Ambulatory Visit: Payer: Self-pay | Admitting: Cardiology

## 2021-03-25 ENCOUNTER — Ambulatory Visit (INDEPENDENT_AMBULATORY_CARE_PROVIDER_SITE_OTHER): Payer: BC Managed Care – PPO

## 2021-03-25 ENCOUNTER — Encounter: Payer: Self-pay | Admitting: *Deleted

## 2021-03-25 ENCOUNTER — Other Ambulatory Visit: Payer: Self-pay | Admitting: *Deleted

## 2021-03-25 DIAGNOSIS — I517 Cardiomegaly: Secondary | ICD-10-CM

## 2021-03-25 DIAGNOSIS — R002 Palpitations: Secondary | ICD-10-CM

## 2021-03-25 NOTE — Progress Notes (Signed)
Long term [employer]

## 2021-03-25 NOTE — Progress Notes (Unsigned)
Enrolled patient for a 3 day Zio XT monitor to be mailed to patients home  

## 2021-04-03 DIAGNOSIS — R002 Palpitations: Secondary | ICD-10-CM

## 2021-04-03 DIAGNOSIS — I517 Cardiomegaly: Secondary | ICD-10-CM

## 2021-07-11 ENCOUNTER — Other Ambulatory Visit: Payer: Self-pay | Admitting: Internal Medicine

## 2021-08-09 ENCOUNTER — Other Ambulatory Visit: Payer: Self-pay | Admitting: Internal Medicine

## 2021-09-10 ENCOUNTER — Other Ambulatory Visit: Payer: Self-pay | Admitting: Internal Medicine

## 2021-10-01 NOTE — Progress Notes (Unsigned)
No chief complaint on file.   HPI: Kaedyn Polivka 61 y.o. come in for FU  HT HCM assymtomatic  ROS: See pertinent positives and negatives per HPI.  Past Medical History:  Diagnosis Date   Hypertension     Family History  Problem Relation Age of Onset   Diabetes Mother    Hypertension Father    Hypertension Brother    ADD / ADHD Son    Anxiety disorder Son     Social History   Socioeconomic History   Marital status: Married    Spouse name: Not on file   Number of children: Not on file   Years of education: Not on file   Highest education level: Not on file  Occupational History   Not on file  Tobacco Use   Smoking status: Never   Smokeless tobacco: Never  Substance and Sexual Activity   Alcohol use: Yes   Drug use: Not on file   Sexual activity: Not on file  Other Topics Concern   Not on file  Social History Narrative   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     Social Determinants of Health   Financial Resource Strain: Not on file  Food Insecurity: Not on file  Transportation Needs: Not on file  Physical Activity: Not on file  Stress: Not on file  Social Connections: Not on file    Outpatient Medications Prior to Visit  Medication Sig Dispense Refill   amLODipine-valsartan (EXFORGE) 5-160 MG tablet TAKE 1 TABLET BY MOUTH DAILY FOR HIGH BLOOD PRESSURE 30 tablet 0   prednisoLONE acetate (PRED FORTE) 1 % ophthalmic suspension Place 1 drop into the left eye 4 (four) times daily.     No facility-administered medications prior to visit.     EXAM:  There were no vitals taken for this visit.  There is no height or weight on file to calculate BMI.  GENERAL: vitals reviewed and listed above, alert, oriented, appears well hydrated and in no acute distress HEENT: atraumatic, conjunctiva  clear, no obvious abnormalities on inspection of external nose and ears OP : no lesion edema or exudate  NECK: no obvious masses on inspection palpation  LUNGS: clear to auscultation  bilaterally, no wheezes, rales or rhonchi, good air movement CV: HRRR, no clubbing cyanosis or  peripheral edema nl cap refill  MS: moves all extremities without noticeable focal  abnormality PSYCH: pleasant and cooperative, no obvious depression or anxiety Lab Results  Component Value Date   WBC 6.2 07/16/2020   HGB 14.7 03/18/2021   HCT 43.6 03/18/2021   PLT 205.0 07/16/2020   GLUCOSE 116 (H) 03/18/2021   CHOL 182 07/16/2020   TRIG 105.0 07/16/2020   HDL 41.10 07/16/2020   LDLCALC 120 (H) 07/16/2020   ALT 31 07/16/2020   AST 20 07/16/2020   NA 141 03/18/2021   K 4.4 03/18/2021   CL 103 03/18/2021   CREATININE 1.12 03/18/2021   BUN 17 03/18/2021   CO2 22 03/18/2021   TSH 0.98 07/16/2020   PSA 3.86 07/16/2020   HGBA1C 5.7 07/16/2020   BP Readings from Last 3 Encounters:  11/30/20 (!) 148/84  07/16/20 (!) 150/102  05/19/18 132/88  FINDINGS: 1. Normal left ventricular size, with LVEDD 50 mm, and LVEDVi 64 mL/m2.   There is asymmetric septal hypertrophy, with left ventricular thickness, with intraventricular septal thickness of 19 mm, posterior wall thickness of 9 mm, but myocardial mass index of 63 g/m2.   Normal left ventricular systolic function (LVEF =74%).  There are no regional wall motion abnormalities.   Left ventricular parametric mapping notable for normal ECV and T2 signal.   There is no late gadolinium enhancement in the left ventricular myocardium. A 6 standard deviation quantitative standard was used.   2. Normal right ventricular size with RVEDVI 65 mL/m2.   Normal right ventricular thickness.   Normal right ventricular systolic function (RVEF =26%). There are no regional wall motion abnormalities or aneurysms.   3.  Normal left and right atrial size.   4.  Normal size of the aortic root and pulmonary artery.   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   5. Valve assessment:   Aortic Valve: Tri-leaflet valve. Qualitatively no  significant regurgitation.   Pulmonic Valve: Qualitatively no significant regurgitation.   Tricuspid Valve: Qualitatively mild, central regurgitation.   Mitral Valve: no significant regurgitation.   6.  Normal pericardium.  No pericardial effusion.   7. Grossly, no extracardiac findings. Recommended dedicated study if concerned for non-cardiac pathology.   8. [place] Artifacts noted. This decreased the sensitivity of LGE assessment.   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   No LGE.   No evidence of LVOT obstruction. Given that CMR is a supine assessment, if concern for inducible SAM, exercise stress echo may be considered.   Rudean Haskell MD     Electronically Signed   By: Rudean Haskell M.D.   On: 03/19/2021 17:22  monitoring 1 episode of nsvtach and 6 svt   ASSESSMENT AND PLAN:  Discussed the following assessment and plan:  No diagnosis found. Sigmoid hcm no lat gadolium  or rest obstruction  Mild thoracid aneurysm  -Patient advised to return or notify health care team  if  new concerns arise.  There are no Patient Instructions on file for this visit.   Standley Brooking. Aulden Calise M.D.

## 2021-10-02 ENCOUNTER — Ambulatory Visit: Payer: BC Managed Care – PPO | Admitting: Internal Medicine

## 2021-10-02 ENCOUNTER — Other Ambulatory Visit: Payer: Self-pay | Admitting: Internal Medicine

## 2021-10-02 ENCOUNTER — Encounter: Payer: Self-pay | Admitting: Internal Medicine

## 2021-10-02 VITALS — BP 130/80 | HR 69 | Temp 98.7°F | Ht 71.0 in | Wt 219.8 lb

## 2021-10-02 DIAGNOSIS — Z0000 Encounter for general adult medical examination without abnormal findings: Secondary | ICD-10-CM

## 2021-10-02 DIAGNOSIS — R002 Palpitations: Secondary | ICD-10-CM

## 2021-10-02 DIAGNOSIS — I422 Other hypertrophic cardiomyopathy: Secondary | ICD-10-CM

## 2021-10-02 DIAGNOSIS — Z79899 Other long term (current) drug therapy: Secondary | ICD-10-CM

## 2021-10-02 DIAGNOSIS — Z8616 Personal history of COVID-19: Secondary | ICD-10-CM

## 2021-10-02 DIAGNOSIS — I10 Essential (primary) hypertension: Secondary | ICD-10-CM

## 2021-10-02 DIAGNOSIS — Z125 Encounter for screening for malignant neoplasm of prostate: Secondary | ICD-10-CM

## 2021-10-02 DIAGNOSIS — I77810 Thoracic aortic ectasia: Secondary | ICD-10-CM | POA: Diagnosis not present

## 2021-10-02 DIAGNOSIS — L609 Nail disorder, unspecified: Secondary | ICD-10-CM

## 2021-10-02 DIAGNOSIS — R000 Tachycardia, unspecified: Secondary | ICD-10-CM

## 2021-10-02 DIAGNOSIS — R21 Rash and other nonspecific skin eruption: Secondary | ICD-10-CM

## 2021-10-02 MED ORDER — AMLODIPINE BESYLATE-VALSARTAN 5-160 MG PO TABS
1.0000 | ORAL_TABLET | Freq: Every day | ORAL | 1 refills | Status: DC
Start: 2021-10-02 — End: 2021-11-28

## 2021-10-02 NOTE — Patient Instructions (Addendum)
Not sure if need further  monitor  about the heart rate.   ? If   beta blocker  would  add .  130/80 and 138/82.    Get [place] BP and pulse readings .   At least 5 days in a row   and pulse if getting   rapid  pulse.   Repeat BP was  Get fasting blood work at  med center [facility].  I will place orders  Then plan follow up .   Will  invite cardiology opinion also

## 2021-11-28 ENCOUNTER — Other Ambulatory Visit: Payer: Self-pay | Admitting: Internal Medicine

## 2022-01-15 NOTE — Progress Notes (Unsigned)
Cardiology Office Note   Date:  01/15/2022   ID:  Paul Yoder, DOB 02-24-1961, MRN [id number]  PCP:  Burnis Medin, MD  Cardiologist:   None Referring:  Burnis Medin, MD  No chief complaint on file.      History of Present Illness: Paul Yoder is a 61 y.o. male who presents for evaluation of an abnormal echo.  Echo in May demonstrated an EF of 60 - 65%.  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  There was moderate hypertrophy with mild SAM.  Aortic dilatation was evident at 42 mm.  I saw him back in 2014 for this.  He had evidence of septal hypertrophy at that time.  MRI in Nov demonstrated septal hypertrophy with no LGE.  There was no SAM.  Septal thickness was 19 mm.  ***    He returns for follow-up.  ***    ***He did have some strange sensation about a month ago where he felt "off".  He was tested and did not have COVID.  He had some tightness across his shoulders and the back and down in his lower back.  He felt fatigued.  He had a bowel movement and all of his symptoms went away.  He actually has not been bothered by any of this since then.  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  He might get a little short of breath but this is very mild and only slowly progressive.  He does not have any resting shortness of breath.  He has very rare palpitations.  Is not any presyncope or syncope.  Has had no weight gain or edema.   Past Medical History:  Diagnosis Date   Hypertension     Past Surgical History:  Procedure Laterality Date   EYE SURGERY N/A    MOLE REMOVAL     OTHER SURGICAL HISTORY     finger cyst removal     Current Outpatient Medications  Medication Sig Dispense Refill   amLODipine-valsartan (EXFORGE) 5-160 MG tablet TAKE 1 TABLET BY MOUTH DAILY FOR HIGH BLOOD PRESSURE 90 tablet 1   No current facility-administered medications for this visit.    Allergies:   Morphine and related    ROS:  Please see the history of  present illness.   Otherwise, review of systems are positive for ***.   All other systems are reviewed and negative.    PHYSICAL EXAM: VS:  There were no vitals taken for this visit. , BMI There is no height or weight on file to calculate BMI. GENERAL:  Well appearing NECK:  No jugular venous distention, waveform within normal limits, carotid upstroke brisk and symmetric, no bruits, no thyromegaly LUNGS:  Clear to auscultation bilaterally CHEST:  Unremarkable HEART:  PMI not displaced or sustained,S1 and S2 within normal limits, no S3, no S4, no clicks, no rubs, *** murmurs ABD:  Flat, positive bowel sounds normal in frequency in pitch, no bruits, no rebound, no guarding, no midline pulsatile mass, no hepatomegaly, no splenomegaly EXT:  2 plus pulses throughout, no edema, no cyanosis no clubbing   ***GENERAL:  Well appearing HEENT:  Pupils equal round and reactive, fundi not visualized, oral mucosa unremarkable NECK:  No jugular venous distention, waveform within normal limits, carotid upstroke brisk and symmetric, no bruits, no thyromegaly LYMPHATICS:  No cervical, inguinal adenopathy LUNGS:  Clear to auscultation bilaterally BACK:  No CVA tenderness CHEST:  Unremarkable HEART:  PMI not displaced or sustained,S1  and S2 within normal limits, no S3, no S4, no clicks, no rubs, 3 out of 6 apical systolic murmur radiating slightly at the aortic outflow tract and increasing with the strain phase of Valsalva, no diastolic murmurs ABD:  Flat, positive bowel sounds normal in frequency in pitch, no bruits, no rebound, no guarding, no midline pulsatile mass, no hepatomegaly, no splenomegaly EXT:  2 plus pulses throughout, no edema, no cyanosis no clubbing SKIN:  No rashes no nodules NEURO:  Cranial nerves II through XII grossly intact, motor grossly intact throughout PSYCH:  Cognitively intact, oriented to person place and time  ECHO:   1. 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 2. Mild SAM no  significant restin LVOT gradient noted but likely small gradient with valsalva signal seems contaminated by MR . Left ventricular ejection fraction, by estimation, is 60 to 65%. The left ventricle has normal function. The left ventricle has no regional wall motion abnormalities. There is moderate left ventricular hypertrophy. Left ventricular diastolic parameters were normal. 3. Right ventricular systolic function is normal. The right ventricular size is normal. There is normal pulmonary artery systolic pressure. 4. Left atrial size was mildly dilated. 5. Right atrial size was mildly dilated. 6. The mitral valve is normal in structure. Mild mitral valve regurgitation. No evidence of mitral stenosis. 7. The aortic valve is tricuspid. Aortic valve regurgitation is trivial. Mild to moderate aortic valve sclerosis/calcification is present, without any evidence of aortic stenosis. 8. 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 Aortic dilatation noted. There is mild dilatation of the aortic root, measuring 39 mm. 9. The inferior vena cava is normal in size with greater than 50% respiratory variability, suggesting right atrial pressure of 3 mmHg.  EKG:  EKG is not ordered today. The ekg ordered 07/16/2020 demonstrates sinus rhythm, rate 67, axis within normal limits, intervals within normal limits, no acute ST-T wave changes.   Recent Labs: 03/18/2021: BUN 17; Creatinine, Ser 1.12; Hemoglobin 14.7; Potassium 4.4; Sodium 141    Lipid Panel    Component Value Date/Time   CHOL 182 07/16/2020 1508   TRIG 105.0 07/16/2020 1508   HDL 41.10 07/16/2020 1508   CHOLHDL 4 07/16/2020 1508   VLDL 21.0 07/16/2020 1508   LDLCALC 120 (H) 07/16/2020 1508      Wt Readings from Last 3 Encounters:  10/02/21 219 lb 12.8 oz (99.7 kg)  11/30/20 224 lb (101.6 kg)  07/16/20 220 lb 3.2 oz (99.9 kg)      Other studies Reviewed: Additional studies/ records that were reviewed today include: Echocardiogram images  reviewed.  This was ordered by his primary provider.. Review of the above records demonstrates:  Please see elsewhere in the note.     ASSESSMENT AND PLAN:  SEPTAL HYPERTROPHY:   ***  He has this diagnosis by echo.  He does not really have a lot of symptoms related to this.  I am going to screen him with an MRI to look for late gadolinium enhancement.  We will measure septal size.  I am going to set him up for genetic counseling.  We talked about how this would affect his son.  Once I get the MRI I am going to probably order a 3-day event monitor again to screen for high risk features.  Otherwise we would manage this symptomatically and with appropriate counseling.  HTN: His blood pressure is ***  mildly elevated today but he says it is well controlled at home and he will start taking it for a few weeks to  monitor it and make sure that it is in target.  Current medicines are reviewed at length with the patient today.  The patient does not have concerns regarding medicines.  The following changes have been made:  ***  Labs/ tests ordered today include:  ***  No orders of the defined types were placed in this encounter.   Disposition:   FU with me in ***   Signed, Minus Breeding, MD  01/15/2022 9:17 PM    [facility]

## 2022-01-16 ENCOUNTER — Ambulatory Visit: Payer: BC Managed Care – PPO | Attending: Cardiology | Admitting: Cardiology

## 2022-01-16 ENCOUNTER — Encounter: Payer: Self-pay | Admitting: Cardiology

## 2022-01-16 VITALS — BP 126/72 | HR 71 | Ht 71.0 in | Wt 227.0 lb

## 2022-01-16 DIAGNOSIS — I10 Essential (primary) hypertension: Secondary | ICD-10-CM | POA: Diagnosis not present

## 2022-01-16 DIAGNOSIS — I422 Other hypertrophic cardiomyopathy: Secondary | ICD-10-CM

## 2022-01-16 NOTE — Patient Instructions (Signed)
Medication Instructions:  NO CHANGES  *If you need a refill on your cardiac medications before your next appointment, please call your pharmacy*  Follow-Up: At [facility], you and your health needs are our priority.  As part of our continuing mission to provide you with exceptional heart care, we have created designated Provider Care Teams.  These Care Teams include your primary Cardiologist (physician) and Advanced Practice Providers (APPs -  Physician Assistants and Nurse Practitioners) who all work together to provide you with the care you need, when you need it.  We recommend signing up for the patient portal called "MyChart".  Sign up information is provided on this After Visit Summary.  MyChart is used to connect with patients for Virtual Visits (Telemedicine).  Patients are able to view lab/test results, encounter notes, upcoming appointments, etc.  Non-urgent messages can be sent to your provider as well.   To learn more about what you can do with MyChart, go to [email or web address].    Your next appointment:    12 months with Dr. Hochrein 

## 2022-01-28 ENCOUNTER — Encounter: Payer: Self-pay | Admitting: Internal Medicine

## 2022-01-28 ENCOUNTER — Other Ambulatory Visit (INDEPENDENT_AMBULATORY_CARE_PROVIDER_SITE_OTHER): Payer: BC Managed Care – PPO

## 2022-01-28 DIAGNOSIS — Z0000 Encounter for general adult medical examination without abnormal findings: Secondary | ICD-10-CM

## 2022-01-28 DIAGNOSIS — R21 Rash and other nonspecific skin eruption: Secondary | ICD-10-CM

## 2022-01-28 DIAGNOSIS — I10 Essential (primary) hypertension: Secondary | ICD-10-CM | POA: Diagnosis not present

## 2022-01-28 DIAGNOSIS — L609 Nail disorder, unspecified: Secondary | ICD-10-CM

## 2022-01-28 DIAGNOSIS — Z125 Encounter for screening for malignant neoplasm of prostate: Secondary | ICD-10-CM

## 2022-01-28 DIAGNOSIS — Z79899 Other long term (current) drug therapy: Secondary | ICD-10-CM

## 2022-01-28 DIAGNOSIS — I422 Other hypertrophic cardiomyopathy: Secondary | ICD-10-CM | POA: Diagnosis not present

## 2022-01-28 DIAGNOSIS — R000 Tachycardia, unspecified: Secondary | ICD-10-CM

## 2022-01-29 ENCOUNTER — Ambulatory Visit: Payer: BC Managed Care – PPO | Admitting: Internal Medicine

## 2022-01-29 ENCOUNTER — Encounter: Payer: Self-pay | Admitting: Internal Medicine

## 2022-01-29 VITALS — BP 116/80 | HR 68 | Temp 98.3°F | Wt 225.8 lb

## 2022-01-29 DIAGNOSIS — Z1211 Encounter for screening for malignant neoplasm of colon: Secondary | ICD-10-CM

## 2022-01-29 DIAGNOSIS — R972 Elevated prostate specific antigen [PSA]: Secondary | ICD-10-CM | POA: Diagnosis not present

## 2022-01-29 DIAGNOSIS — I422 Other hypertrophic cardiomyopathy: Secondary | ICD-10-CM

## 2022-01-29 DIAGNOSIS — Z79899 Other long term (current) drug therapy: Secondary | ICD-10-CM

## 2022-01-29 DIAGNOSIS — E7800 Pure hypercholesterolemia, unspecified: Secondary | ICD-10-CM

## 2022-01-29 DIAGNOSIS — I10 Essential (primary) hypertension: Secondary | ICD-10-CM | POA: Diagnosis not present

## 2022-01-29 LAB — CBC WITH DIFFERENTIAL/PLATELET
Basophils Absolute: 0.1 10*3/uL (ref 0.0–0.1)
Basophils Relative: 0.8 % (ref 0.0–3.0)
Eosinophils Absolute: 0.2 10*3/uL (ref 0.0–0.7)
Eosinophils Relative: 2.3 % (ref 0.0–5.0)
HCT: 42.8 % (ref 39.0–52.0)
Hemoglobin: 14.4 g/dL (ref 13.0–17.0)
Lymphocytes Relative: 32.5 % (ref 12.0–46.0)
Lymphs Abs: 2.2 10*3/uL (ref 0.7–4.0)
MCHC: 33.7 g/dL (ref 30.0–36.0)
MCV: 89.7 fl (ref 78.0–100.0)
Monocytes Absolute: 0.6 10*3/uL (ref 0.1–1.0)
Monocytes Relative: 8.4 % (ref 3.0–12.0)
Neutro Abs: 3.9 10*3/uL (ref 1.4–7.7)
Neutrophils Relative %: 56 % (ref 43.0–77.0)
Platelets: 229 10*3/uL (ref 150.0–400.0)
RBC: 4.77 Mil/uL (ref 4.22–5.81)
RDW: 13.2 % (ref 11.5–15.5)
WBC: 6.9 10*3/uL (ref 4.0–10.5)

## 2022-01-29 LAB — HEPATIC FUNCTION PANEL
ALT: 31 U/L (ref 0–53)
AST: 21 U/L (ref 0–37)
Albumin: 4.5 g/dL (ref 3.5–5.2)
Alkaline Phosphatase: 41 U/L (ref 39–117)
Bilirubin, Direct: 0.1 mg/dL (ref 0.0–0.3)
Total Bilirubin: 0.6 mg/dL (ref 0.2–1.2)
Total Protein: 7.1 g/dL (ref 6.0–8.3)

## 2022-01-29 LAB — HEMOGLOBIN A1C: Hgb A1c MFr Bld: 5.9 % (ref 4.6–6.5)

## 2022-01-29 LAB — PSA: PSA: 4.54 ng/mL — ABNORMAL HIGH (ref 0.10–4.00)

## 2022-01-29 LAB — BASIC METABOLIC PANEL
BUN: 19 mg/dL (ref 6–23)
CO2: 27 mEq/L (ref 19–32)
Calcium: 9.5 mg/dL (ref 8.4–10.5)
Chloride: 103 mEq/L (ref 96–112)
Creatinine, Ser: 1.18 mg/dL (ref 0.40–1.50)
GFR: 66.23 mL/min (ref >60.00)
Glucose, Bld: 83 mg/dL (ref 70–99)
Potassium: 4.6 mEq/L (ref 3.5–5.1)
Sodium: 139 mEq/L (ref 135–145)

## 2022-01-29 LAB — LIPID PANEL
Cholesterol: 183 mg/dL (ref 0–200)
HDL: 39.8 mg/dL (ref >39.00)
LDL Cholesterol: 107 mg/dL — ABNORMAL HIGH (ref 0–99)
NonHDL: 142.79
Total CHOL/HDL Ratio: 5
Triglycerides: 179 mg/dL — ABNORMAL HIGH (ref 0.0–149.0)
VLDL: 35.8 mg/dL (ref 0.0–40.0)

## 2022-01-29 LAB — T4, FREE: Free T4: 0.82 ng/dL (ref 0.60–1.60)

## 2022-01-29 LAB — TSH: TSH: 0.98 u[IU]/mL (ref 0.35–5.50)

## 2022-01-29 NOTE — Progress Notes (Signed)
Chief Complaint  Patient presents with   Follow-up   Hypertension    HPI: Paul Yoder 61 y.o. come in for Fu   bp etc . Has seen Cards since then dr H  and follow  no restrictions since no high risk  conditions.  : BP seems to be controlled now.  On exforge.  No se reported  Labs  done to review  Never got contacted referral about colonoscopy for GI colon cancer screening.  ROS: See pertinent positives and negatives per HPI.  Dramatic changes but does have nocturia x1 and may be a little bit decrease in urinary stream Negative family history prostate cancer father has a bladder cancer that is under treatment control Past Medical History:  Diagnosis Date   Hypertension     Family History  Problem Relation Age of Onset   Diabetes Mother    Hypertension Father    Hypertension Brother    ADD / ADHD Son    Anxiety disorder Son     Social History   Socioeconomic History   Marital status: Married    Spouse name: Not on file   Number of children: Not on file   Years of education: Not on file   Highest education level: Bachelor's degree (e.g., BA, AB, BS)  Occupational History   Not on file  Tobacco Use   Smoking status: Never   Smokeless tobacco: Never  Substance and Sexual Activity   Alcohol use: Yes   Drug use: Not on file   Sexual activity: Not on file  Other Topics Concern   Not on file  Social History Narrative   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     Social Determinants of Health   Financial Resource Strain: Low Risk  (10/01/2021)   Overall Financial Resource Strain (CARDIA)    Difficulty of Paying Living Expenses: Not hard at all  Food Insecurity: No Food Insecurity (10/01/2021)   Hunger Vital Sign    Worried About Running Out of Food in the Last Year: Never true    Ran Out of Food in the Last Year: Never true  Transportation Needs: No Transportation Needs (10/01/2021)   PRAPARE - Hydrologist (Medical): No    Lack of Transportation  (Non-Medical): No  Physical Activity: Sufficiently Active (10/01/2021)   Exercise Vital Sign    Days of Exercise per Week: 5 days    Minutes of Exercise per Session: 60 min  Stress: No Stress Concern Present (10/01/2021)   [place]    Feeling of Stress : Only a little  Social Connections: Unknown (10/01/2021)   Social Connection and Isolation Panel [NHANES]    Frequency of Communication with Friends and Family: Once a week    Frequency of Social Gatherings with Friends and Family: Patient refused    Attends Religious Services: More than 4 times per year    Active Member of [employer] or Organizations: Yes    Attends Music therapist: More than 4 times per year    Marital Status: Married    Outpatient Medications Prior to Visit  Medication Sig Dispense Refill   amLODipine-valsartan (EXFORGE) 5-160 MG tablet TAKE 1 TABLET BY MOUTH DAILY FOR HIGH BLOOD PRESSURE 90 tablet 1   No facility-administered medications prior to visit.     EXAM:  BP 116/80 (BP Location: Left Arm, Patient Position: Sitting, Cuff Size: Large)   Pulse 68   Temp 98.3 F (36.8  C) (Oral)   Wt 225 lb 12.8 oz (102.4 kg)   SpO2 96%   BMI 31.49 kg/m   Body mass index is 31.49 kg/m. 128/80  home monitor  GENERAL: vitals reviewed and listed above, alert, oriented, appears well hydrated and in no acute distress HEENT: atraumatic, conjunctiva  clear, no obvious abnormalities on inspection of external nose and ears NECK: no obvious masses on inspection palpation  CV: HRRR, no clubbing cyanosis or  peripheral edema nl cap refill  MS: moves all extremities without noticeable focal  abnormality PSYCH: pleasant and cooperative, no obvious depression or anxiety Lab Results  Component Value Date   WBC 6.9 01/28/2022   HGB 14.4 01/28/2022   HCT 42.8 01/28/2022   PLT 229.0 01/28/2022   GLUCOSE 83 01/28/2022   CHOL 183 01/28/2022   TRIG 179.0  (H) 01/28/2022   HDL 39.80 01/28/2022   LDLCALC 107 (H) 01/28/2022   ALT 31 01/28/2022   AST 21 01/28/2022   NA 139 01/28/2022   K 4.6 01/28/2022   CL 103 01/28/2022   CREATININE 1.18 01/28/2022   BUN 19 01/28/2022   CO2 27 01/28/2022   TSH 0.98 01/28/2022   PSA 4.54 (H) 01/28/2022   HGBA1C 5.9 01/28/2022   BP Readings from Last 3 Encounters:  01/29/22 116/80  01/16/22 126/72  10/02/21 130/80   Review cardiology note  and labs  The 10-year ASCVD risk score (Arnett DK, et al., 2019) is: 10.8%   Values used to calculate the score:     Age: 21 years     Sex: Male     Is Non-Hispanic African American: No     Diabetic: No     Tobacco smoker: No     Systolic Blood Pressure: 856 mmHg     Is BP treated: Yes     HDL Cholesterol: 39.8 mg/dL     Total Cholesterol: 183 mg/dL  ASSESSMENT AND PLAN:  Discussed the following assessment and plan:  Essential hypertension - controlled  cont,home bp apparatus is correlating  Medication management  Elevated LDL cholesterol level - disc fu 6 mos lipi panel lipo a etc  after lsi  Elevated PSA, less than 10 ng/ml - fu lab 4-6 weeks fraction  may plan urology referral  - Plan: PSA, Total and Free  Hypertrophic cardiomyopathy ([place]) - no sig sx  as per dr H  exercise   program  Colon cancer screening - got delayed   referral?  - Plan: Ambulatory referral to Gastroenterology  -Patient advised to return or notify health care team  if  new concerns arise.  Patient Instructions  Glad Bp is good . Continue same medication.  I referred today  for colonoscopy screening   Let me know  if you don't get contacted .  Plan repeat psa with fractionation.   Next month  If ongoing    referral to urology .  Intensify lifestyle interventions. As you planned .   Want to get lipids looking better .    Standley Brooking. Haisley Arens M.D.

## 2022-01-29 NOTE — Patient Instructions (Addendum)
Glad Bp is good . Continue same medication.  I referred today  for colonoscopy screening   Let me know  if you don't get contacted .  Plan repeat psa with fractionation.   Next month  If ongoing    referral to urology .  Intensify lifestyle interventions. As you planned .   Want to get lipids looking better .

## 2022-01-29 NOTE — Progress Notes (Signed)
Disc at OV

## 2022-02-03 NOTE — Telephone Encounter (Signed)
Thanks   seem reasonable levels  diatolic a bit high but acceptable    No  change in  plans

## 2022-03-04 ENCOUNTER — Ambulatory Visit: Payer: BC Managed Care – PPO | Attending: Genetic Counselor | Admitting: Genetic Counselor

## 2022-03-10 ENCOUNTER — Telehealth: Payer: Self-pay | Admitting: *Deleted

## 2022-03-10 DIAGNOSIS — I422 Other hypertrophic cardiomyopathy: Secondary | ICD-10-CM

## 2022-03-10 NOTE — Telephone Encounter (Signed)
-----   Message from Minus Breeding, MD sent at 03/09/2022  3:35 PM EST ----- Regarding: RE: HCM Genetic test order  ----- Message ----- From: Debbe Mounts, PhD Sent: 03/04/2022   5:39 PM EST To: Minus Breeding, MD Subject: HCM Genetic test order                         Clearnce Sorrel, Thank you for referring Paul Yoder. I just finished seeing him and he is agreeable to move forward with HCM genetic testing.  Since his insurance will require a PA and the doctor's order, can you please place an order for HCM genetic testing.  Take care [facility]

## 2022-03-11 NOTE — Progress Notes (Cosign Needed)
Pre Test Genetic Consult  Referring Provider: Minus Breeding, MD   Referral Reason  Paul Yoder is referred for genetic consult and testing for the hypertrophic cardiomyopathy subsequent to cardiac imaging studies that detected cardiac wall thickness suggestive of HCM.  Paul Yoder (III.1 on pedigree) is a 61 year old Caucasian gentleman who tells me that he was found to have an abnormal echocardiogram at a routine physical at age 70 that detected cardiac wall thickness suggestive of HCM. He has been under the care of Dr. Percival Spanish since then. He reports having shortness of breath that developed after he contracted COVID19 last October. Also states having fatigue when walking uphill, chest discomfort with chest tightness when exercising, heart palpitations and occasional dizziness. Does report having syncope in his 93s while rock climbing.  Traditional Risk Factors Paul Yoder reports being diagnosed with HTN in his teens and tells me that he did not treat it until 2 years ago.    Family history  Relation to Proband Pedigree # Current age Heart condition/age of onset Notes  Son IV.1 26 None Echo/EKG ND  Identical twin brothers IV.2-IV.3 37 None Echo/EKG ND  Nephews, Nieces IV.2-IV.6 26-11 None         Father II.1 24 Afib @ 33 Echo/EKG ND  Paternal grandfather I.1 Deceased 3-vessel CABG @ 17s Died of cardiac issues 2 74  Paternal grandmother I.2 Deceased None Died of old age @ 89        Mother II.2 65 Pedal edema- on Lasix diabetes @ 25  Maternal uncle II.3 Deceased Heart issues Sudden death @ 43 while picking up the newspaper on the driveway, autopsy not done.  Maternal grandfather I.3 Deceased  Died @ 53 of ?   Maternal grandmother I.4 Deceased None Died of old age @ 22, bone cancer   Genetics Paul Yoder was counseled on the genetics of hypertrophic cardiomyopathy (HCM). I explained to the patient that this is an autosomal dominant condition with incomplete  penetrance i.e. not all individuals harboring the HCM mutation will present clinically with HCM, and age-related penetrance where clinical presentation of HCM increases with advanced age.   Since HCM is an autosomal dominant condition, first degree-relatives are at a 50% risk of inheriting this condition. They should seek regular surveillance for HCM by EKG and echocardiogram.  Explained to patient that frequency of clinical screening is typically determined by age, with those in their teens undergoing screening every year and those over the age of 76 getting screened every 3-5 years. Patient verbalized understanding of this.   I informed the patient that about 8-10% of HCM patients can have compound and digenic mutations for HCM. Also briefly discussed the inheritance pattern and treatment /management plans for the infiltrative cardiomyopathies that present as HCM phenocopies.   We walked through the process of genetic testing. I explained to the patient that genetic testing is a probabilistic test dependent upon age and severity of presentation, presence of risk factors for HCM and importantly family history of HCM or sudden death in first-degree relatives.   The potential outcomes of genetic testing and subsequent management of at-risk family members were discussed to manage expectations-  If a mutation is not identified, then all first-degree relatives should undergo regular screening for HCM. I emphasized that even if the genetic test is negative, it does not mean that he does not have HCM. A negative test result can be due to limitations of the genetic test. Patient verbalized understanding of this.  There is also the  likelihood of identifying a "Variant of unknown significance." This result means that the variant has not been detected in a statistically significant number of HCM patients and/or functional studies have not been performed to verify its pathogenicity. This VUS can be tested in the  family to see if it segregates with disease. If a VUS is found, first-degree relatives should undergo regular clinical screening for HCM.  If a pathogenic variant is reported, then first-degree family members can get tested for this variant. If they test positive, it is likely they will develop HCM. Considering variable expression and incomplete penetrance associated with HCM, it is not possible to predict when they will manifest clinically with HCM. It is recommended that family members that test positive for the familial pathogenic variant pursue clinical screening for HCM. Family members that test negative for the familial mutation need not pursue periodic screening for HCM, but seek care if symptoms develop.  Impression  Paul Yoder was found to have cardiac wall thickness suggestive of HCM at age 66. In addition, he reports sudden death in a maternal second degree relative and mother having symptoms indicative of heart failure. His age of presentation, severity of symptoms and maternal family history of sudden death at an early age, is indicative of a genetic condition. It thus, seems highly likely that he has inherited this condition from his mother.   Genetic testing for HCM is recommended. This test should include the sarcomeric genes implicated in HCM as well as the genes for the HCM phenocopies, such as Fabry disease, Danon disease, WPW syndrome and familial transthyretin amyloidosis.  In addition, we discussed the protections afforded by the Genetic Information Non-Discrimination Act (GINA). I explained to the patient that GINA protects him from losing his employment or health insurance based on his genotype. However, these protections do not cover life insurance and disability. Patient verbalized understanding of this and informs me that his son does not have life insurance.  Please note that the patient has not been counseled in this visit on personal, cultural, or ethical issues that he may face  due to his heart condition.   Plan After a thorough discussion of the risk and benefits of genetic testing for HCM, Paul Yoder states his intent to pursue genetic testing for HCM and signed the informed consent. Blood was drawn today for HCM genetic testing.   Lattie Corns, Ph.D, [facility] Clinical Molecular Geneticist

## 2022-03-18 ENCOUNTER — Other Ambulatory Visit: Payer: BC Managed Care – PPO

## 2022-03-18 ENCOUNTER — Ambulatory Visit (AMBULATORY_SURGERY_CENTER): Payer: Self-pay | Admitting: *Deleted

## 2022-03-18 VITALS — Ht 71.0 in | Wt 228.0 lb

## 2022-03-18 DIAGNOSIS — R972 Elevated prostate specific antigen [PSA]: Secondary | ICD-10-CM

## 2022-03-18 DIAGNOSIS — Z1211 Encounter for screening for malignant neoplasm of colon: Secondary | ICD-10-CM

## 2022-03-18 MED ORDER — NA SULFATE-K SULFATE-MG SULF 17.5-3.13-1.6 GM/177ML PO SOLN: 1.0000 | Freq: Once | ORAL | 0 refills | Status: AC

## 2022-03-18 NOTE — Progress Notes (Signed)
No egg or soy allergy known to patient  No issues known to pt with past sedation with any surgeries or procedures Patient denies ever being told they had issues or difficulty with intubation  No FH of Malignant Hyperthermia Pt is not on diet pills Pt is not on  home 02  Pt is not on blood thinners  Pt denies issues with constipation  Pt encouraged to use to use Singlecare or Goodrx to reduce cost  Patient's chart reviewed by Osvaldo Angst CNRA prior to previsit and patient appropriate for the [place].  Previsit completed and red dot placed by patient's name on their procedure day (on provider's schedule).  . In person Coupon given

## 2022-03-20 LAB — PSA, TOTAL AND FREE
PSA, % Free: 34 % (calc) (ref >25)
PSA, Free: 2.1 ng/mL
PSA, Total: 6.2 ng/mL — ABNORMAL HIGH (ref <=4.0)

## 2022-04-08 ENCOUNTER — Encounter: Payer: Self-pay | Admitting: Gastroenterology

## 2022-04-08 ENCOUNTER — Ambulatory Visit (AMBULATORY_SURGERY_CENTER): Payer: BC Managed Care – PPO | Admitting: Gastroenterology

## 2022-04-08 VITALS — BP 103/68 | HR 77 | Temp 98.4°F | Resp 14 | Ht 71.0 in | Wt 228.0 lb

## 2022-04-08 DIAGNOSIS — D120 Benign neoplasm of cecum: Secondary | ICD-10-CM | POA: Diagnosis not present

## 2022-04-08 DIAGNOSIS — D122 Benign neoplasm of ascending colon: Secondary | ICD-10-CM

## 2022-04-08 DIAGNOSIS — D123 Benign neoplasm of transverse colon: Secondary | ICD-10-CM

## 2022-04-08 DIAGNOSIS — Z1211 Encounter for screening for malignant neoplasm of colon: Secondary | ICD-10-CM | POA: Diagnosis not present

## 2022-04-08 MED ORDER — SODIUM CHLORIDE 0.9 % IV SOLN: 500.0000 mL | Freq: Once | INTRAVENOUS | Status: DC

## 2022-04-08 NOTE — Progress Notes (Signed)
VS completed by CW.   Pt's states no medical or surgical changes since previsit or office visit.  

## 2022-04-08 NOTE — Progress Notes (Signed)
Report to pacu rn. Vss. Care resumed by rn. 

## 2022-04-08 NOTE — Progress Notes (Signed)
[facility] History and Physical   Primary Care Physician:  Burnis Medin, MD   Reason for Procedure:   Colon cancer screening  Plan:    Screening colonoscopy     HPI: Paul Yoder is a 61 y.o. male undergoing initial average risk screening colonoscopy.  He has no family history of colon cancer and no chronic GI symptoms.    Past Medical History:  Diagnosis Date   Allergy    Heart murmur    Hypertension     Past Surgical History:  Procedure Laterality Date   EYE SURGERY N/A    MOLE REMOVAL     odontonus     OTHER SURGICAL HISTORY     finger cyst removal    Prior to Admission medications   Medication Sig Start Date End Date Taking? Authorizing Provider  amLODipine-valsartan (EXFORGE) 5-160 MG tablet TAKE 1 TABLET BY MOUTH DAILY FOR HIGH BLOOD PRESSURE 11/28/21   Panosh, Standley Brooking, MD    Current Outpatient Medications  Medication Sig Dispense Refill   amLODipine-valsartan (EXFORGE) 5-160 MG tablet TAKE 1 TABLET BY MOUTH DAILY FOR HIGH BLOOD PRESSURE 90 tablet 1   Current Facility-Administered Medications  Medication Dose Route Frequency Provider Last Rate Last Admin   0.9 %  sodium chloride infusion  500 mL Intravenous Once Daryel November, MD        Allergies as of 04/08/2022 - Review Complete 01/29/2022  Allergen Reaction Noted   Morphine and related Other (See Comments) 10/18/2010    Family History  Problem Relation Age of Onset   Diabetes Mother    Hypertension Father    Hypertension Brother    ADD / ADHD Son    Anxiety disorder Son    Colon cancer Neg Hx    Colon polyps Neg Hx    Esophageal cancer Neg Hx    Rectal cancer Neg Hx    Stomach cancer Neg Hx     Social History   Socioeconomic History   Marital status: Married    Spouse name: Not on file   Number of children: Not on file   Years of education: Not on file   Highest education level: Bachelor's degree (e.g., BA, AB, BS)  Occupational History   Not on file   Tobacco Use   Smoking status: Never   Smokeless tobacco: Never  Substance and Sexual Activity   Alcohol use: Yes    Comment: 1 glass a  year   Drug use: Never   Sexual activity: Not on file  Other Topics Concern   Not on file  Social History Narrative   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     Social Determinants of Health   Financial Resource Strain: Low Risk  (10/01/2021)   Overall Financial Resource Strain (CARDIA)    Difficulty of Paying Living Expenses: Not hard at all  Food Insecurity: No Food Insecurity (10/01/2021)   Hunger Vital Sign    Worried About Running Out of Food in the Last Year: Never true    Ran Out of Food in the Last Year: Never true  Transportation Needs: No Transportation Needs (10/01/2021)   PRAPARE - Hydrologist (Medical): No    Lack of Transportation (Non-Medical): No  Physical Activity: Sufficiently Active (10/01/2021)   Exercise Vital Sign    Days of Exercise per Week: 5 days    Minutes of Exercise per Session: 60 min  Stress: No Stress Concern Present (10/01/2021)   [employer]  [employer] - Occupational Stress Questionnaire    Feeling of Stress : Only a little  Social Connections: Unknown (10/01/2021)   Social Connection and Isolation Panel [NHANES]    Frequency of Communication with Friends and Family: Once a week    Frequency of Social Gatherings with Friends and Family: Patient refused    Attends Religious Services: More than 4 times per year    Active Member of [employer] or Organizations: Yes    Attends Music therapist: More than 4 times per year    Marital Status: Married  Human resources officer Violence: Not on file    Review of Systems:  All other review of systems negative except as mentioned in the HPI.  Physical Exam: Vital signs BP (!) 145/70   Pulse 68   Temp 98.4 F (36.9 C) (Temporal)   Ht '5\' 11"'$  (1.803 m)   Wt 228 lb (103.4 kg)   SpO2 97%   BMI 31.80 kg/m   General:   Alert,   Well-developed, well-nourished, pleasant and cooperative in NAD Airway:  Mallampati 1 Lungs:  Clear throughout to auscultation.   Heart:  Regular rate and rhythm; no murmurs, clicks, rubs,  or gallops. Abdomen:  Soft, nontender and nondistended. Normal bowel sounds.   Neuro/Psych:  Normal mood and affect. A and O x 3   Sakeena Teall E. Candis Schatz, MD [facility]

## 2022-04-08 NOTE — Op Note (Signed)
[facility] Patient Name: Paul Yoder Procedure Date: 04/08/2022 10:33 AM MRN: [id number] Endoscopist: Nicki Reaper E. Candis Schatz , MD, [id number] Age: 61 Referring MD:  Date of Birth: [birth year] Gender: Male Account #: [id number] Procedure:                Colonoscopy Indications:              Screening for colorectal malignant neoplasm, This                            is the patient's first colonoscopy Medicines:                Monitored Anesthesia Care Procedure:                Pre-Anesthesia Assessment:                           - Prior to the procedure, a History and Physical                            was performed, and patient medications and                            allergies were reviewed. The patient's tolerance of                            previous anesthesia was also reviewed. The risks                            and benefits of the procedure and the sedation                            options and risks were discussed with the patient.                            All questions were answered, and informed consent                            was obtained. Prior Anticoagulants: The patient has                            taken no anticoagulant or antiplatelet agents. ASA                            Grade Assessment: II - A patient with mild systemic                            disease. After reviewing the risks and benefits,                            the patient was deemed in satisfactory condition to                            undergo the procedure.  After obtaining informed consent, the colonoscope                            was passed under direct vision. Throughout the                            procedure, the patient's blood pressure, pulse, and                            oxygen saturations were monitored continuously. The                            CF [id number] #[id number] was introduced through the anus                            and advanced  to the the terminal ileum, with                            identification of the appendiceal orifice and IC                            valve. The colonoscopy was performed without                            difficulty. The patient tolerated the procedure                            well. The quality of the bowel preparation was                            good. The terminal ileum, ileocecal valve,                            appendiceal orifice, and rectum were photographed.                            The bowel preparation used was SUPREP via split                            dose instruction. Scope In: 10:38:42 AM Scope Out: 11:02:10 AM Scope Withdrawal Time: 0 hours 20 minutes 59 seconds  Total Procedure Duration: 0 hours 23 minutes 28 seconds  Findings:                 The perianal and digital rectal examinations were                            normal. Pertinent negatives include normal                            sphincter tone and no palpable rectal lesions.                           Two sessile polyps were found in the cecum. The  polyps were 2 to 6 mm in size. These polyps were                            removed with a cold snare. Resection and retrieval                            were complete. Estimated blood loss was minimal.                           A 7 mm polyp was found in the ascending colon. The                            polyp was sessile. The polyp was removed with a                            cold snare. Resection and retrieval were complete.                            Estimated blood loss was minimal.                           A 4 mm polyp was found in the hepatic flexure. The                            polyp was sessile. The polyp was removed with a                            cold snare. Resection and retrieval were complete.                            Estimated blood loss was minimal.                           A 2 mm polyp was found in the  transverse colon. The                            polyp was sessile. The polyp was removed with a                            cold snare. Resection and retrieval were complete.                            Estimated blood loss was minimal.                           The exam was otherwise normal throughout the                            examined colon.                           The terminal ileum appeared normal.  The retroflexed view of the distal rectum and anal                            verge was normal and showed no anal or rectal                            abnormalities. Complications:            No immediate complications. Estimated Blood Loss:     Estimated blood loss was minimal. Impression:               - Two 2 to 6 mm polyps in the cecum, removed with a                            cold snare. Resected and retrieved.                           - One 7 mm polyp in the ascending colon, removed                            with a cold snare. Resected and retrieved.                           - One 4 mm polyp at the hepatic flexure, removed                            with a cold snare. Resected and retrieved.                           - One 2 mm polyp in the transverse colon, removed                            with a cold snare. Resected and retrieved.                           - The examined portion of the ileum was normal.                           - The distal rectum and anal verge are normal on                            retroflexion view. Recommendation:           - Patient has a contact number available for                            emergencies. The signs and symptoms of potential                            delayed complications were discussed with the                            patient. Return to normal activities tomorrow.  Written discharge instructions were provided to the                            patient.                            - Resume previous diet.                           - Continue present medications.                           - Await pathology results.                           - Repeat colonoscopy (date not yet determined) for                            surveillance based on pathology results. Jacqualynn Parco E. Candis Schatz, MD 04/08/2022 11:08:03 AM This report has been signed electronically.

## 2022-04-08 NOTE — Patient Instructions (Signed)
-   Patient has a contact number available for emergencies. The signs and symptoms of potential delayed complications were discussed with the patient. Return to normalactivities tomorrow. Written discharge instructions were provided to the patient. - Resume previous diet. - Continue present medications. - Await pathology results. - Repeat colonoscopy (date not yet determined) for surveillance based on pathology results. -Handout on polyps provided   YOU HAD AN ENDOSCOPIC PROCEDURE TODAY AT [facility]:   Refer to the procedure report that was given to you for any specific questions about what was found during the examination.  If the procedure report does not answer your questions, please call your gastroenterologist to clarify.  If you requested that your care partner not be given the details of your procedure findings, then the procedure report has been included in a sealed envelope for you to review at your convenience later.  YOU SHOULD EXPECT: Some feelings of bloating in the abdomen. Passage of more gas than usual.  Walking can help get rid of the air that was put into your GI tract during the procedure and reduce the bloating. If you had a lower endoscopy (such as a colonoscopy or flexible sigmoidoscopy) you may notice spotting of blood in your stool or on the toilet paper. If you underwent a bowel prep for your procedure, you may not have a normal bowel movement for a few days.  Please Note:  You might notice some irritation and congestion in your nose or some drainage.  This is from the oxygen used during your procedure.  There is no need for concern and it should clear up in a day or so.  SYMPTOMS TO REPORT IMMEDIATELY:  Following lower endoscopy (colonoscopy or flexible sigmoidoscopy):  Excessive amounts of blood in the stool  Significant tenderness or worsening of abdominal pains  Swelling of the abdomen that is new, acute  Fever of 100F or higher  For urgent or  emergent issues, a gastroenterologist can be reached at any hour by calling [phone]. Do not use MyChart messaging for urgent concerns.    DIET:  We do recommend a small meal at first, but then you may proceed to your regular diet.  Drink plenty of fluids but you should avoid alcoholic beverages for 24 hours.  ACTIVITY:  You should plan to take it easy for the rest of today and you should NOT DRIVE or use heavy machinery until tomorrow (because of the sedation medicines used during the test).    FOLLOW UP: Our staff will call the number listed on your records the next business day following your procedure.  We will call around 7:15- 8:00 am to check on you and address any questions or concerns that you may have regarding the information given to you following your procedure. If we do not reach you, we will leave a message.     If any biopsies were taken you will be contacted by phone or by letter within the next 1-3 weeks.  Please call us at [phone] if you have not heard about the biopsies in 3 weeks.    SIGNATURES/CONFIDENTIALITY: You and/or your care partner have signed paperwork which will be entered into your electronic medical record.  These signatures attest to the fact that that the information above on your After Visit Summary has been reviewed and is understood.  Full responsibility of the confidentiality of this discharge information lies with you and/or your care-partner.

## 2022-04-09 ENCOUNTER — Telehealth: Payer: Self-pay

## 2022-04-09 NOTE — Telephone Encounter (Signed)
  Follow up Call-     04/08/2022   10:19 AM  Call back number  Post procedure Call Back phone  # [phone]  Permission to leave phone message Yes     Patient questions:  Do you have a fever, pain , or abdominal swelling? No. Pain Score  0 *  Have you tolerated food without any problems? Yes.    Have you been able to return to your normal activities? Yes.    Do you have any questions about your discharge instructions: Diet   No. Medications  No. Follow up visit  No.  Do you have questions or concerns about your Care? No.  Actions: * If pain score is 4 or above: No action needed, pain <4.

## 2022-04-15 NOTE — Progress Notes (Signed)
Paul Yoder,   The five polyps that I removed during your recent procedure were completely benign but were proven to be "pre-cancerous" polyps that MAY have grown into cancers if they had not been removed.  Studies shows that at least 20% of women over age 62 and 30% of men over age 65 have pre-cancerous polyps.  Based on current nationally recognized surveillance guidelines, I recommend that you have a repeat colonoscopy in 3 years.   If you develop any new rectal bleeding, abdominal pain or significant bowel habit changes, please contact me before then.

## 2022-05-06 ENCOUNTER — Other Ambulatory Visit: Payer: Self-pay | Admitting: Family

## 2022-05-06 DIAGNOSIS — R972 Elevated prostate specific antigen [PSA]: Secondary | ICD-10-CM

## 2022-05-13 ENCOUNTER — Other Ambulatory Visit: Payer: Self-pay | Admitting: Internal Medicine

## 2022-06-10 ENCOUNTER — Ambulatory Visit: Payer: BC Managed Care – PPO | Attending: Genetic Counselor | Admitting: Genetic Counselor

## 2022-06-10 DIAGNOSIS — I422 Other hypertrophic cardiomyopathy: Secondary | ICD-10-CM

## 2022-06-20 NOTE — Progress Notes (Signed)
Post-test Genetic Consultation notes  This is a virtual visit with Paul Yoder for his post-test genetic consult. Patient identity was confirmed with two unique identifiers.   [name] reports no changes to his medical history since we last met. I then reviewed his family tree and enquired if his son and siblings had undergone an echocardiogram and EKG over the past couple of months. He tells me that he has not yet spoken to them about this and was planning to start the conversation after his HCM genetic test results.   He tells me that his brother (III.2) has been hospitalized for blood clos and is on blood thinners. Mother (II.2) has HTN since her late 59s and maternal grandmother (I.4) had breast cancer @ 68. He also provides the health history of his great grandparents- none had HCM, had early death or sudden death.   I informed [name] that he does not have a pathogenic variant for HCM. I explained the reasons as to why his genetic test was negative. Clinical testing is restricted to the coding regions of the gene thus precluding the regulatory control elements that may impact gene function and hence protein activity.   I informed him that since he has a negative genetic result, it would be appropriate for his first-degree relatives, namely his son, age 63 as well as his younger twin brothers, ages 68 to seek regular cardiology surveillance for HCM. He verbalized understanding.  I reiterated that a negative test does not mean that he does not have a genetic condition. It is important to screen his first-degree relatives, namely son and siblings every 3-5 years by echocardiogram and EKG. Also informed him that sarcomere negative HCM patients typically have a better prognosis. He verbalized understanding.  Lattie Corns, Ph.D, [facility] Clinical Molecular Geneticist

## 2022-07-01 ENCOUNTER — Encounter: Payer: BC Managed Care – PPO | Admitting: Genetic Counselor

## 2022-11-12 IMAGING — MR MR CARD MORPHOLOGY WO/W CM
44 of 48 series · 44 of 48 positions shown · IV contrast (gadavist)
Comparison: none

CLINICAL DATA: Clinical question of HCM
Study assumes HCT of 44 and BSA of 2.3.

EXAM:
CARDIAC MRI
TECHNIQUE: The patient was scanned on a 1.5 Tesla GE magnet. A dedicated
cardiac coil was used. Functional imaging was done using Fiesta
sequences. [DATE], and 4 chamber views were done to assess for RWMA's.
Modified Mammar rule using a short axis stack was used to
calculate an ejection fraction on a dedicated work station using
Circle software. The patient received 10 cc of Gadavist. After 10
minutes inversion recovery sequences were used to assess for
infiltration and scar tissue.
CONTRAST:  10 cc  of Gadavist

[Series 4: [id number] · axial · 8.0mm · 1.48mm/px · 1 of 17 slices shown]
[im 1/17]
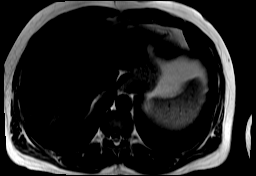

[Series 9: bSSFP · oblique · 8.0mm · 1.70mm/px · 1 of 25 slices shown (1 of 21)]
[im 1/25]
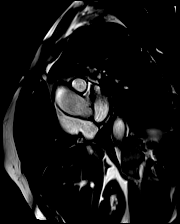

[Series 10: bSSFP · oblique · 8.0mm · 1.70mm/px · 1 of 25 slices shown (2 of 21)]
[im 1/25]
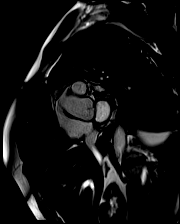

[Series 11: bSSFP · oblique · 8.0mm · 1.70mm/px · 1 of 25 slices shown (3 of 21)]
[im 1/25]
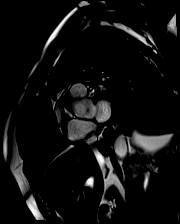

[Series 12: bSSFP · oblique · 8.0mm · 1.70mm/px · 1 of 25 slices shown (4 of 21)]
[im 1/25]
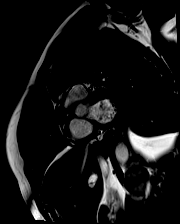

[Series 13: bSSFP · oblique · 8.0mm · 1.70mm/px · 1 of 25 slices shown (5 of 21)]
[im 1/25]
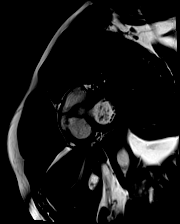

[Series 14: bSSFP · oblique · 8.0mm · 1.70mm/px · 1 of 25 slices shown (6 of 21)]
[im 1/25]
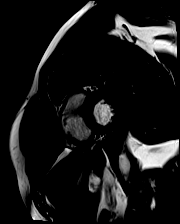

[Series 15: bSSFP · oblique · 8.0mm · 1.70mm/px · 1 of 25 slices shown (7 of 21)]
[im 1/25]
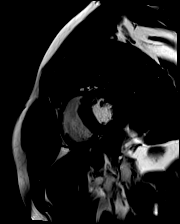

[Series 16: bSSFP · oblique · 8.0mm · 1.70mm/px · 1 of 25 slices shown (8 of 21)]
[im 1/25]
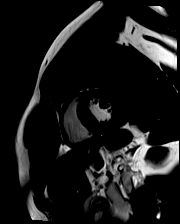

[Series 17: bSSFP · oblique · 8.0mm · 1.70mm/px · 1 of 25 slices shown (9 of 21)]
[im 1/25]
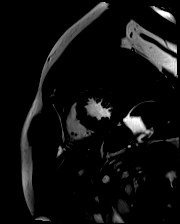

[Series 18: bSSFP · oblique · 8.0mm · 1.70mm/px · 1 of 25 slices shown (10 of 21)]
[im 1/25]
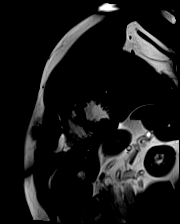

[Series 19: bSSFP · oblique · 8.0mm · 1.70mm/px · 1 of 25 slices shown (11 of 21)]
[im 1/25]
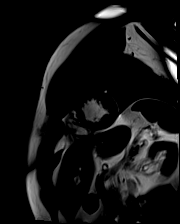

[Series 20: bSSFP · oblique · 8.0mm · 1.70mm/px · 1 of 25 slices shown (12 of 21)]
[im 1/25]
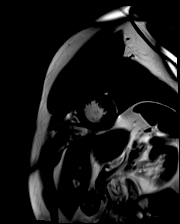

[Series 21: bSSFP · oblique · 8.0mm · 1.70mm/px · 1 of 25 slices shown (13 of 21)]
[im 1/25]
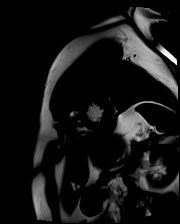

[Series 22: bSSFP · oblique · 8.0mm · 1.70mm/px · 1 of 25 slices shown (14 of 21)]
[im 1/25]
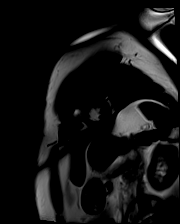

[Series 23: bSSFP · oblique · 8.0mm · 1.70mm/px · 1 of 25 slices shown (15 of 21)]
[im 1/25]
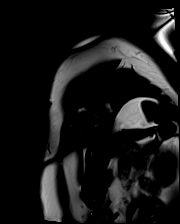

[Series 24: bSSFP · oblique · 8.0mm · 1.70mm/px · 1 of 25 slices shown (16 of 21)]
[im 1/25]
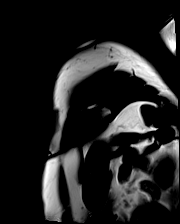

[Series 25: bSSFP · oblique · 8.0mm · 1.70mm/px · 1 of 25 slices shown (17 of 21)]
[im 1/25]
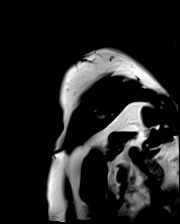

[Series 26: bSSFP · oblique · 6.0mm · 1.48mm/px · 1 of 25 slices shown (18 of 21)]
[im 1/25]
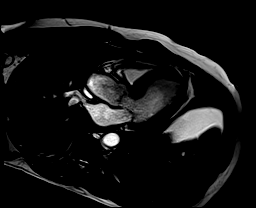

[Series 27: bSSFP · oblique · 6.0mm · 1.48mm/px · 1 of 25 slices shown (19 of 21)]
[im 1/25]
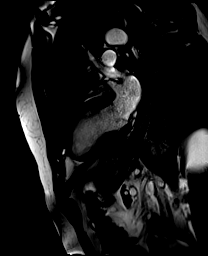

[Series 28: bSSFP · axial · 6.0mm · 1.48mm/px · 1 of 25 slices shown (20 of 21)]
[im 1/25]
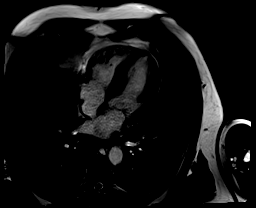

[Series 29: (id)_long_t1 · oblique · 8.0mm · 1.56mm/px · 1 of 24 slices shown]
[im 1/24]
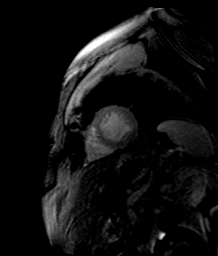

[Series 30: (id)_long_t1_moco · oblique · 8.0mm · 1.56mm/px · 1 of 24 slices shown]
[im 1/24]
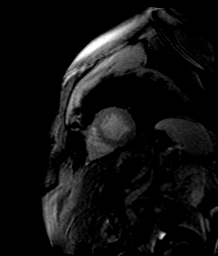

[Series 33: (id)_trufi · oblique · 8.0mm · 2.08mm/px · 1 of 9 slices shown]
[im 1/9]
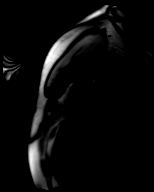

[Series 34: (id)_trufi_moco · oblique · 8.0mm · 2.08mm/px · 1 of 9 slices shown]
[im 1/9]
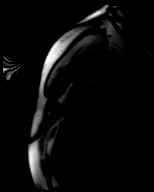

[Series 37: pre short axis · oblique · non-contrast · 8.0mm · 2.38mm/px · 1 of 10 slices shown (1 of 6)]
[im 1/10]
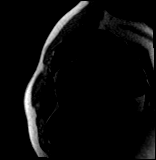

[Series 38: pre short axis · oblique · non-contrast · 8.0mm · 2.38mm/px · 1 of 10 slices shown (2 of 6)]
[im 1/10]
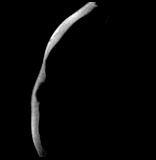

[Series 39: pre short axis · oblique · non-contrast · 8.0mm · 2.38mm/px · 1 of 10 slices shown (3 of 6)]
[im 1/10]
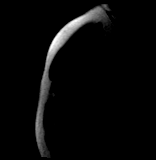

[Series 40: pre short axis · oblique · non-contrast · 8.0mm · 2.38mm/px · 1 of 10 slices shown (4 of 6)]
[im 1/10]
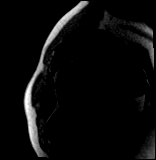

[Series 41: pre short axis · oblique · non-contrast · 8.0mm · 2.38mm/px · 1 of 10 slices shown (5 of 6)]
[im 1/10]
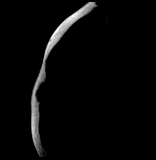

[Series 42: pre short axis · oblique · non-contrast · 8.0mm · 2.38mm/px · 1 of 10 slices shown (6 of 6)]
[im 1/10]
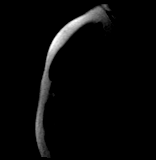

[Series 43: rest short axis · oblique · 8.0mm · 2.38mm/px · 1 of 60 slices shown (1 of 6)]
[im 1/60]
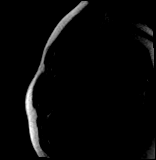

[Series 44: rest short axis · oblique · 8.0mm · 2.38mm/px · 1 of 60 slices shown (2 of 6)]
[im 1/60]
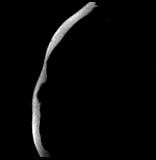

[Series 45: rest short axis · oblique · 8.0mm · 2.38mm/px · 1 of 60 slices shown (3 of 6)]
[im 1/60]
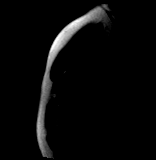

[Series 46: rest short axis · oblique · 8.0mm · 2.38mm/px · 1 of 60 slices shown (4 of 6)]
[im 1/60]
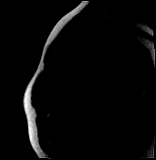

[Series 47: rest short axis · oblique · 8.0mm · 2.38mm/px · 1 of 60 slices shown (5 of 6)]
[im 1/60]
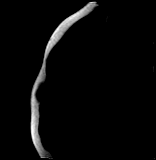

[Series 48: rest short axis · oblique · 8.0mm · 2.38mm/px · 1 of 60 slices shown (6 of 6)]
[im 1/60]
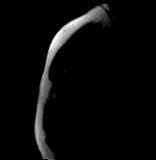

[Series 49: bSSFP · coronal · 6.0mm · 1.41mm/px · 1 of 25 slices shown (21 of 21)]
[im 1/25]
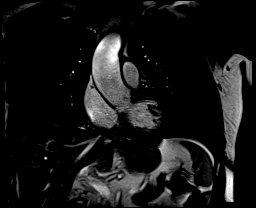

[Series 50: cine rvit · oblique · 6.0mm · 1.41mm/px · 1 of 25 slices shown]
[im 1/25]
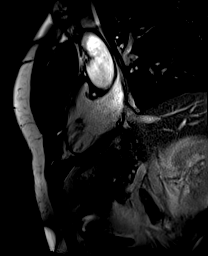

[Series 51: aortic valve cine · axial · 6.0mm · 1.41mm/px · 1 of 25 slices shown]
[im 1/25]
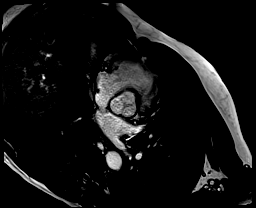

[Series 52: cine rvot · sagittal · 6.0mm · 1.41mm/px · 1 of 25 slices shown]
[im 1/25]
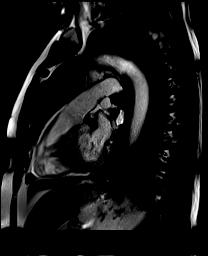

[Series 54: lge_single shot sa · oblique · 8.0mm · 2.08mm/px · 1 of 18 slices shown (1 of 2)]
[im 1/18]
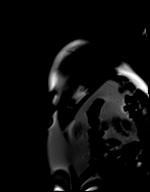

[Series 55: lge_single shot sa · oblique · 8.0mm · 2.08mm/px · 1 of 18 slices shown (2 of 2)]
[im 1/18]
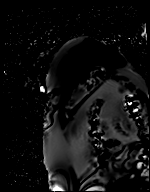

[Series 58: lge_single shot 4 · axial · 6.0mm · 1.98mm/px · 1 of 1 slices shown]
[im 1/1]
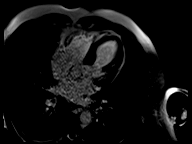

[44 of 48 positions shown; findings below may reference images not displayed]

FINDINGS: 1. Normal left ventricular size, with LVEDD 50 mm, and LVEDVi 64
mL/m2.

There is asymmetric septal hypertrophy, with left ventricular
thickness, with intraventricular septal thickness of 19 mm,
posterior wall thickness of 9 mm, but myocardial mass index of 63
g/m2.

Normal left ventricular systolic function (LVEF =59%). There are no
regional wall motion abnormalities.

Left ventricular parametric mapping notable for normal ECV and T2
signal.

There is no late gadolinium enhancement in the left ventricular
myocardium. A 6 standard deviation quantitative standard was used.

2. Normal right ventricular size with RVEDVI 65 mL/m2.

Normal right ventricular thickness.

Normal right ventricular systolic function (RVEF =53%). There are no
regional wall motion abnormalities or aneurysms.

3.  Normal left and right atrial size.

4.  Normal size of the aortic root and pulmonary artery.

Mild thoracic aortic aneurysm 43 mm.

5. Valve assessment:

Aortic Valve: Tri-leaflet valve. Qualitatively no significant
regurgitation.

Pulmonic Valve: Qualitatively no significant regurgitation.

Tricuspid Valve: Qualitatively mild, central regurgitation.

Mitral Valve: no significant regurgitation.

6.  Normal pericardium.  No pericardial effusion.

7. Grossly, no extracardiac findings. Recommended dedicated study if
concerned for non-cardiac pathology.

8. Breathhold Artifacts noted. This decreased the sensitivity of LGE
assessment.
IMPRESSION: MRI consistent with hypertrophic cardiomyopathy: Sigmoid variant.

No LGE.

No evidence of LVOT obstruction. Given that GIORGI is a supine
assessment, if concern for inducible EDLEIDE, exercise stress echo may
be considered.

## 2022-11-13 ENCOUNTER — Other Ambulatory Visit: Payer: Self-pay | Admitting: Internal Medicine

## 2023-02-17 ENCOUNTER — Other Ambulatory Visit: Payer: Self-pay | Admitting: Internal Medicine

## 2023-02-17 NOTE — Telephone Encounter (Signed)
LOV: 01/29/2022  Instruction from note visit:   Instructions   Return in 6 months (on 07/31/2022) for lab in 4-6 weeks  fract ps , 6 mos to fu lipids.   Attempted to reach pt. Call cannot completed at this time. Will try again.

## 2023-07-13 ENCOUNTER — Ambulatory Visit: Admitting: Family Medicine

## 2023-07-13 ENCOUNTER — Encounter: Payer: Self-pay | Admitting: Family Medicine

## 2023-07-13 ENCOUNTER — Ambulatory Visit: Payer: Self-pay

## 2023-07-13 VITALS — BP 134/96 | HR 80 | Temp 98.7°F | Wt 222.2 lb

## 2023-07-13 DIAGNOSIS — J069 Acute upper respiratory infection, unspecified: Secondary | ICD-10-CM

## 2023-07-13 NOTE — Telephone Encounter (Signed)
 Chief Complaint: headache Symptoms: headache, fever, runny nose, loss of appetite Frequency: 2 weeks ago, worsening Pertinent Negatives: Patient denies sob, chest pain, head injury Disposition: [] ED /[] Urgent Care (no appt availability in office) / [x] Appointment(In office/virtual)/ []  [facility]/ [] Home Care/ [] Refused Recommended Disposition /[] [facility]/ []  Follow-up with PCP Additional Notes: Patient reports he started getting sick approx 2 weeks ago with what he believes was norovirus. At the time he was experiencing GI discomfort. Patient reports tha discomfort has since passed but he has had no appetite since, has been running intermittent fevers and has a constant headache. Patient reports drainage from nose is clear but that he does feel pressure on the left side of his nose. Per protocol, appt scheduled today 3/24. Patient advised to call back with worsening symptoms. Patient verbalized understanding.   Copied from CRM [id number]. Topic: Clinical - Red Word Triage >> Jul 13, 2023  8:28 AM Kathryne Eriksson wrote: Red Word that prompted transfer to Nurse Triage: Severe Headache / Fever >> Jul 13, 2023  8:31 AM Kathryne Eriksson wrote: Patient states for about the last 2 weeks he's been experiencing a fever that comes and goes, as well as these headaches. Patient also states he's been having some experiences with runny nose and haven't had an appetite in almost a week. Patient provided his mobile number [phone]  Reason for Disposition  [1] New headache AND [2] age > 74  Answer Assessment - Initial Assessment Questions 1. LOCATION: "Where does it hurt?"      Whole head 2. ONSET: "When did the headache start?" (Minutes, hours or days)      2 weeks ago 3. PATTERN: "Does the pain come and go, or has it been constant since it started?"     Constant 4. SEVERITY: "How bad is the pain?" and "What does it keep you from doing?"  (e.g., Scale 1-10; mild, moderate, or severe)    - MILD (1-3): doesn't interfere with normal activities    - MODERATE (4-7): interferes with normal activities or awakens from sleep    - SEVERE (8-10): excruciating pain, unable to do any normal activities        mild 5. RECURRENT SYMPTOM: "Have you ever had headaches before?" If Yes, ask: "When was the last time?" and "What happened that time?"      no 6. CAUSE: "What do you think is causing the headache?"     Believes he had norovirus approx 2 weeks ago 7. MIGRAINE: "Have you been diagnosed with migraine headaches?" If Yes, ask: "Is this headache similar?"      none 8. HEAD INJURY: "Has there been any recent injury to the head?"      none 9. OTHER SYMPTOMS: "Do you have any other symptoms?" (fever, stiff neck, eye pain, sore throat, cold symptoms)   Fever, runny nose, loss of appetite  Protocols used: Headache-A-AH

## 2023-07-13 NOTE — Progress Notes (Signed)
 Established Patient Office Visit  Subjective   Patient ID: Paul Yoder, male    DOB: 17-Apr-1961  Age: 63 y.o. MRN: [id number]  Chief Complaint  Patient presents with   Headache    Patient complains of headaches, x4 days,    Fatigue    Patient complains of fatigue, x2 weeks     HPI   Paul Yoder has history of hypertension and hypertrophic cardiomyopathy.  He states that about 2 weeks ago he developed presumed norovirus infection.  Never tested but had acute vomiting and diarrhea along with about a day and a half of fever.  Diarrhea and vomiting resolved fairly promptly but he had some lingering malaise for several days.  This was followed by a period of feeling improved for a few days until this past Thursday when he developed some nasal congestion and cough along with headaches.  Body aches and fever up to 100.2.  Is had some right maxillary sinus pressure.  No fever documented today.  Does have history of HOCM and knows the importance of staying well-hydrated.  Denies any recent increase in dyspnea.  No recent syncope.  Past Medical History:  Diagnosis Date   Allergy    Heart murmur    Hypertension    Past Surgical History:  Procedure Laterality Date   COLONOSCOPY     EYE SURGERY N/A    MOLE REMOVAL     odontonus     OTHER SURGICAL HISTORY     finger cyst removal    reports that he has never smoked. He has never used smokeless tobacco. He reports current alcohol use. He reports that he does not use drugs. family history includes ADD / ADHD in his son; Anxiety disorder in his son; Diabetes in his mother; Hypertension in his brother and father. Allergies  Allergen Reactions   Morphine And Codeine Other (See Comments)    Iv morphine gave resp depression and brady at reg dose .  Re lower dose if needed    Review of Systems  Constitutional:  Positive for fever.  HENT:  Positive for congestion and sinus pain.   Respiratory:  Positive for cough.   Gastrointestinal:   Negative for abdominal pain, diarrhea, nausea and vomiting.  Genitourinary:  Negative for dysuria.  Neurological:  Positive for headaches.      Objective:     BP (!) 134/96   Pulse 80   Temp 98.7 F (37.1 C) (Oral)   Wt 222 lb 3.2 oz (100.8 kg)   SpO2 98%   BMI 30.99 kg/m  BP Readings from Last 3 Encounters:  07/13/23 (!) 134/96  04/08/22 103/68  01/29/22 116/80   Wt Readings from Last 3 Encounters:  07/13/23 222 lb 3.2 oz (100.8 kg)  04/08/22 228 lb (103.4 kg)  03/18/22 228 lb (103.4 kg)      Physical Exam Vitals reviewed.  Constitutional:      General: He is not in acute distress.    Appearance: He is not ill-appearing.  HENT:     Mouth/Throat:     Mouth: Mucous membranes are moist.     Pharynx: Oropharynx is clear.  Cardiovascular:     Rate and Rhythm: Normal rate and regular rhythm.  Pulmonary:     Effort: Pulmonary effort is normal.     Breath sounds: Normal breath sounds. No wheezing or rales.  Musculoskeletal:     Cervical back: Neck supple.  Lymphadenopathy:     Cervical: No cervical adenopathy.  Neurological:  Mental Status: He is alert.      No results found for any visits on 07/13/23.    The 10-year ASCVD risk score (Arnett DK, et al., 2019) is: 14.8%    Assessment & Plan:   Patient had recent viral gastroenteritis symptoms couple weeks ago which eventually resolved and now presents with 4-day history of upper respiratory symptoms of cough and nasal congestion and headache.  Suspect viral.  Patient nontoxic in appearance.  Normal lung exam.  Afebrile at this time.  -Recommend plenty of fluids and rest -No clear indication for antibiotics at this time. -Be in touch if fever not resolving by Wednesday and follow-up sooner for any new or worsening symptoms  Evelena Peat, MD

## 2023-09-08 ENCOUNTER — Other Ambulatory Visit: Payer: Self-pay | Admitting: Family

## 2023-10-08 NOTE — Progress Notes (Signed)
 Cardiology Office Note:   Date:  10/09/2023  ID:  Paul Yoder, DOB 1960/06/03, MRN [id number] PCP: Reginal Capra, MD  [facility] Providers Cardiologist:  None {  History of Present Illness:   Paul Yoder is a 63 y.o. male who presents for evaluation of an abnormal echo.  Echo in May demonstrated an EF of 60 - 65%.  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  There was moderate hypertrophy with mild SAM.  Aortic dilatation was evident at 42 mm.  I saw him back in 2014 for this.  He had evidence of septal hypertrophy at that time.  MRI in Nov demonstrated septal hypertrophy with no LGE.  There was no SAM.  Septal thickness was 19 mm.    He wore a monitor and had no ventricular arrhythmias.  There was brief supraventricular tachycardia.   He returns for follow-up.  He has had some increased palpitations since I last saw him.  He feels his heart racing sporadically maybe lasting 2 to 3 minutes.  He thinks is very rapid and he is actually learned to bear down and get rid of it.  He thinks has happened about 6 times in the past year.  It is not associated with presyncope or syncope.  It does feel uncomfortable in his chest but he does not describe chest pressure, neck discomfort or arm discomfort.  He gets occasional sporadic shortness of breath but is very short-lived.  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  He is not having any weight gain or edema.  He thought he lost a few pounds and he did lose a couple of pounds.   ROS: As stated in the HPI and negative for all other systems.   Studies Reviewed:    EKG:   EKG Interpretation Date/Time:  Friday October 09 2023 16:28:46 EDT Ventricular Rate:  66 PR Interval:  208 QRS Duration:  92 QT Interval:  418 QTC Calculation: 438 R Axis:   6  Text Interpretation: Sinus rhythm with Premature supraventricular complexes Minimal voltage criteria for LVH, may be normal variant ( R in aVL ) When  compared with ECG of 05-Nov-2012 09:11, Premature supraventricular complexes are now Present Confirmed by Eilleen Grates ([id number]) on 10/09/2023 5:46:52 PM     Risk Assessment/Calculations:     Physical Exam:   VS:  BP (!) 148/83   Pulse 65   Ht 5' 11 (1.803 m)   Wt 225 lb 3.2 oz (102.2 kg)   SpO2 96%   BMI 31.41 kg/m    Wt Readings from Last 3 Encounters:  10/09/23 225 lb 3.2 oz (102.2 kg)  07/13/23 222 lb 3.2 oz (100.8 kg)  04/08/22 228 lb (103.4 kg)     GEN: Well nourished, well developed in no acute distress NECK: No JVD; No carotid bruits CARDIAC: RRR, 2 out of 6 apical systolic murmur radiating up the aortic outflow tract and increasing slightly with the strain phase of Valsalva murmurs, rubs, gallops RESPIRATORY:  Clear to auscultation without rales, wheezing or rhonchi  ABDOMEN: Soft, non-tender, non-distended EXTREMITIES:  No edema; No deformity   ASSESSMENT AND PLAN:   SEPTAL HYPERTROPHY:   The patient has had genetic counseling.  He has no high risk features for sudden cardiac death.  He does have a little shortness of breath but this could be weight and deconditioning.  We had a long conversation about this and I will see him back in a  few months to see if with improved exercise and weight loss his breathing is better.  I will be adjusting his meds as below.    HTN: His blood pressure is not at target.  This will be addressed as below.  PALPITATIONS: Suspect he is having some SVT.  He had this briefly on monitors a couple of years ago but sadly he is probably having increased burden of this.  I am going to stop his Exforge  and continue the valsartan  but start metoprolol 50 mg in place of the amlodipine .  I am also going to place a 4-week monitor.  Follow up ***  Signed, Eilleen Grates, MD

## 2023-10-09 ENCOUNTER — Encounter: Payer: Self-pay | Admitting: Cardiology

## 2023-10-09 ENCOUNTER — Ambulatory Visit: Attending: Cardiology | Admitting: Cardiology

## 2023-10-09 VITALS — BP 148/83 | HR 65 | Ht 71.0 in | Wt 225.2 lb

## 2023-10-09 DIAGNOSIS — I517 Cardiomegaly: Secondary | ICD-10-CM | POA: Diagnosis not present

## 2023-10-09 DIAGNOSIS — I10 Essential (primary) hypertension: Secondary | ICD-10-CM | POA: Diagnosis not present

## 2023-10-09 DIAGNOSIS — R002 Palpitations: Secondary | ICD-10-CM | POA: Diagnosis not present

## 2023-10-09 MED ORDER — METOPROLOL SUCCINATE ER 50 MG PO TB24: 50.0000 mg | ORAL_TABLET | Freq: Every day | ORAL | 3 refills | Status: AC

## 2023-10-09 MED ORDER — VALSARTAN 160 MG PO TABS: 160.0000 mg | ORAL_TABLET | Freq: Every day | ORAL | 3 refills | Status: AC

## 2023-10-09 NOTE — Patient Instructions (Signed)
 Medication Instructions:  Stop [employer] Valsartan  160 mg once daily Start Toprol XL 50 mg once daily *If you need a refill on your cardiac medications before your next appointment, please call your pharmacy*  Lab Work: NONE If you have labs (blood work) drawn today and your tests are completely normal, you will receive your results only by: MyChart Message (if you have MyChart) OR A paper copy in the mail If you have any lab test that is abnormal or we need to change your treatment, we will call you to review the results.  Testing/Procedures: 4 Week Event Monitor Your physician has recommended that you wear an event monitor. Event monitors are medical devices that record the heart's electrical activity. Doctors most often us  these monitors to diagnose arrhythmias. Arrhythmias are problems with the speed or rhythm of the heartbeat. The monitor is a small, portable device. You can wear one while you do your normal daily activities. This is usually used to diagnose what is causing palpitations/syncope (passing out).   Follow-Up: At [facility], you and your health needs are our priority.  As part of our continuing mission to provide you with exceptional heart care, our providers are all part of one team.  This team includes your primary Cardiologist (physician) and Advanced Practice Providers or APPs (Physician Assistants and Nurse Practitioners) who all work together to provide you with the care you need, when you need it.  Your next appointment:   3 months  Provider:   Lavonne Prairie, MD  We recommend signing up for the patient portal called MyChart.  Sign up information is provided on this After Visit Summary.  MyChart is used to connect with patients for Virtual Visits (Telemedicine).  Patients are able to view lab/test results, encounter notes, upcoming appointments, etc.  Non-urgent messages can be sent to your provider as well.   To learn more about what you can do with  MyChart, go to [email or web address].

## 2023-11-25 ENCOUNTER — Ambulatory Visit: Attending: Cardiology

## 2023-11-25 DIAGNOSIS — R002 Palpitations: Secondary | ICD-10-CM

## 2023-11-30 DIAGNOSIS — R002 Palpitations: Secondary | ICD-10-CM | POA: Diagnosis not present

## 2023-12-03 ENCOUNTER — Ambulatory Visit: Payer: Self-pay | Admitting: Cardiology

## 2023-12-24 ENCOUNTER — Encounter: Payer: Self-pay | Admitting: Cardiology

## 2024-02-02 DIAGNOSIS — R002 Palpitations: Secondary | ICD-10-CM | POA: Insufficient documentation

## 2024-02-02 DIAGNOSIS — I517 Cardiomegaly: Secondary | ICD-10-CM | POA: Insufficient documentation

## 2024-02-02 NOTE — Progress Notes (Unsigned)
  Cardiology Office Note:   Date:  02/04/2024  ID:  Paul Yoder, DOB 01-28-1961, MRN [id number] PCP: Charlett Apolinar POUR, MD  [facility] Providers Cardiologist:  None {  History of Present Illness:   Paul Yoder is a 63 y.o. male  who presents for evaluation of an abnormal echo.  Echo in May demonstrated an EF of 60 - 65%.  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  There was moderate hypertrophy with mild SAM.  Aortic dilatation was evident at 42 mm.  I saw him back in 2014 for this.  He had evidence of septal hypertrophy at that time.  MRI in Nov 2022 demonstrated septal hypertrophy with no LGE.  There was no SAM.  Septal thickness was 19 mm.    He wore a monitor and had no ventricular arrhythmias.  There was brief supraventricular tachycardia.  He was having increased palpitations at the last visit.  He wore a monitor in August and had rare SVT and NSVT.  At the next visit he had some increased palpitations.  Monitor demonstrated rare SVT and on SVT with the longest non-SVT being 7 beats.  He was started on some beta-blockers and returns for follow-up.   Since that time he has had fewer palpitations.  He is not having any of the episodes that were more sustained where he had to bear down.  He has not any presyncope or syncope.  He feels well.  He staying active.  He has a very physical job.  He denies chest pressure, neck or arm discomfort.  He had no weight gain or edema.   ROS: As stated in the HPI and negative for all other systems.  Studies Reviewed:    EKG:     NA3  Risk Assessment/Calculations:              Physical Exam:   VS:  BP 130/65 (BP Location: Right Arm, Patient Position: Sitting)   Pulse (!) 55   SpO2 99%    Wt Readings from Last 3 Encounters:  10/09/23 225 lb 3.2 oz (102.2 kg)  07/13/23 222 lb 3.2 oz (100.8 kg)  04/08/22 228 lb (103.4 kg)     GEN: Well nourished, well developed in no acute distress NECK: No JVD; No carotid bruits CARDIAC: RRR, no  murmurs, rubs, gallops RESPIRATORY:  Clear to auscultation without rales, wheezing or rhonchi  ABDOMEN: Soft, non-tender, non-distended EXTREMITIES:  No edema; No deformity   ASSESSMENT AND PLAN:   SEPTAL HYPERTROPHY: The patient's had genetic counseling.  He has no high risk features for sudden cardiac death.  He is feeling well.  He is not describing any other shortness of breath he was having.  No change in therapy.   HTN: His blood pressure is at target.  No change in therapy.    PALPITATIONS: His palpitations are improved.  He has no sustained symptoms.  No change in therapy.  Follow up with me in 1 year or sooner if needed.  Signed, Lynwood Schilling, MD

## 2024-02-04 ENCOUNTER — Ambulatory Visit: Attending: Cardiology | Admitting: Cardiology

## 2024-02-04 ENCOUNTER — Encounter: Payer: Self-pay | Admitting: Cardiology

## 2024-02-04 VITALS — BP 130/65 | HR 55

## 2024-02-04 DIAGNOSIS — I10 Essential (primary) hypertension: Secondary | ICD-10-CM | POA: Diagnosis not present

## 2024-02-04 DIAGNOSIS — I517 Cardiomegaly: Secondary | ICD-10-CM | POA: Diagnosis not present

## 2024-02-04 DIAGNOSIS — R002 Palpitations: Secondary | ICD-10-CM

## 2024-02-04 NOTE — Patient Instructions (Signed)
 Medication Instructions:  Your physician recommends that you continue on your current medications as directed. Please refer to the Current Medication list given to you today.  *If you need a refill on your cardiac medications before your next appointment, please call your pharmacy*  Lab Work: None.  If you have labs (blood work) drawn today and your tests are completely normal, you will receive your results only by: MyChart Message (if you have MyChart) OR A paper copy in the mail If you have any lab test that is abnormal or we need to change your treatment, we will call you to review the results.  Testing/Procedures: None.  Follow-Up: At [facility] you and your health needs are our priority.  As part of our continuing mission to provide you with exceptional heart care, our providers are all part of one team.  This team includes your primary Cardiologist (physician) and Advanced Practice Providers or APPs (Physician Assistants and Nurse Practitioners) who all work together to provide you with the care you need, when you need it.  Your next appointment:   1 year(s)  Provider:   Dr. DOROTHA Schilling, MD

## 2024-05-25 ENCOUNTER — Ambulatory Visit: Admitting: Internal Medicine

## 2024-05-25 ENCOUNTER — Encounter: Payer: Self-pay | Admitting: Internal Medicine

## 2024-05-25 VITALS — BP 122/80 | HR 63 | Temp 98.0°F | Ht 71.0 in | Wt 230.4 lb

## 2024-05-25 DIAGNOSIS — R053 Chronic cough: Secondary | ICD-10-CM

## 2024-05-25 MED ORDER — AZITHROMYCIN 250 MG PO TABS: ORAL_TABLET | ORAL | 0 refills | Status: AC

## 2024-05-25 NOTE — Patient Instructions (Addendum)
 Change cough  lozenges  .    To sugar free candy to avoid    Cough irritation.    Empiric antibiotic  as planned . ( 5 days work for 10 )   If not improving in the next 2 weeks consider getting chest x ray and fu .

## 2024-05-25 NOTE — Progress Notes (Signed)
 "  Chief Complaint  Patient presents with   Cough    Pt c/o ongoing cough for 44 days. Generally clear to yellow phelgm. Taking Cough drops, OTC suppressant, Cetirizine Hydrochloride oral solution. . Denied fever. Slight headache, drainage. Energy improved from few weeks ago.     HPI: Paul Yoder 64 y.o. come in for    above    After November  pnd in am  and then after x mas  Dec 27 28  cough and drainage increased  worse .  Post nasal drainage was severe  No fever.  Pos fatigue and no headache.   And productive cough . And fits of  cough worse  early January .  Had episode on floor after  dis dizziness with cough when laying down .   ( Cough syncope)   and now otc    not better .    Not tested flu covid .  Worse in pm.   And now clear . Sinus pressure comes and goes  Wife had a mild viersion   On going  head congestion and  productive cough although getting clearer .  Hx of allergy as a child  No asthma  ROS: See pertinent positives and negatives per HPI.  Past Medical History:  Diagnosis Date   Allergy    Heart murmur    Hypertension     Family History  Problem Relation Age of Onset   Diabetes Mother    Hypertension Father    Hypertension Brother    ADD / ADHD Son    Anxiety disorder Son    Colon cancer Neg Hx    Colon polyps Neg Hx    Esophageal cancer Neg Hx    Rectal cancer Neg Hx    Stomach cancer Neg Hx     Social History   Socioeconomic History   Marital status: Married    Spouse name: Not on file   Number of children: Not on file   Years of education: Not on file   Highest education level: Bachelor's degree (e.g., BA, AB, BS)  Occupational History   Not on file  Tobacco Use   Smoking status: Never   Smokeless tobacco: Never  Vaping Use   Vaping status: Never Used  Substance and Sexual Activity   Alcohol use: Yes    Comment: 1 glass a  year   Drug use: Never   Sexual activity: Not on file  Other Topics Concern   Not on file  Social History  Narrative   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     Social Drivers of Health   Tobacco Use: Low Risk (05/25/2024)   Patient History    Smoking Tobacco Use: Never    Smokeless Tobacco Use: Never    Passive Exposure: Not on file  Financial Resource Strain: Low Risk (10/01/2021)   Overall Financial Resource Strain (CARDIA)    Difficulty of Paying Living Expenses: Not hard at all  Food Insecurity: No Food Insecurity (10/01/2021)   Hunger Vital Sign    Worried About Running Out of Food in the Last Year: Never true    Ran Out of Food in the Last Year: Never true  Transportation Needs: No Transportation Needs (10/01/2021)   PRAPARE - Administrator, Civil Service (Medical): No    Lack of Transportation (Non-Medical): No  Physical Activity: Sufficiently Active (10/01/2021)   Exercise Vital Sign    Days of Exercise per Week: 5 days    Minutes of  Exercise per Session: 60 min  Stress: No Stress Concern Present (10/01/2021)   [employer] of Occupational Health - Occupational Stress Questionnaire    Feeling of Stress : Only a little  Social Connections: Unknown (10/01/2021)   Social Connection and Isolation Panel    Frequency of Communication with Friends and Family: Once a week    Frequency of Social Gatherings with Friends and Family: Patient declined    Attends Religious Services: More than 4 times per year    Active Member of Clubs or Organizations: Yes    Attends Banker Meetings: More than 4 times per year    Marital Status: Married  Depression (PHQ2-9): Low Risk (07/13/2023)   Depression (PHQ2-9)    PHQ-2 Score: 0  Alcohol Screen: Low Risk (10/01/2021)   Alcohol Screen    Last Alcohol Screening Score (AUDIT): 1  Housing: Low Risk (10/01/2021)   Housing    Last Housing Risk Score: 0  Utilities: Not on file  Health Literacy: Not on file    Outpatient Medications Prior to Visit  Medication Sig Dispense Refill   metoprolol  succinate (TOPROL -XL) 50 MG 24 hr tablet Take 1  tablet (50 mg total) by mouth daily. Take with or immediately following a meal. 90 tablet 3   valsartan  (DIOVAN ) 160 MG tablet Take 1 tablet (160 mg total) by mouth daily. 90 tablet 3   No facility-administered medications prior to visit.     EXAM:  BP 122/80 (BP Location: Left Arm, Patient Position: Sitting, Cuff Size: Large)   Pulse 63   Temp 98 F (36.7 C) (Oral)   Ht 5' 11 (1.803 m)   Wt 230 lb 6.4 oz (104.5 kg)   SpO2 95%   BMI 32.13 kg/m   Body mass index is 32.13 kg/m.  GENERAL: vitals reviewed and listed above, alert, oriented, appears well hydrated and in no acute distress mild congestion  nl speech and non toxic  ocass  bronchial cough  HEENT: atraumatic, conjunctiva  clear, no obvious abnormalities on inspection of external nose and earstm claear   mild facial pressure  OP : no lesion edema or exudate  NECK: no obvious masses on inspection palpation ( says he feels glands are swollen)  LUNGS: clear to auscultation bilaterally, no wheezes, rales or rhonchi, good air movement x one tubular sound   with cough  CV: HRRR, no clubbing cyanosis or  peripheral edema nl cap refill  MS: moves all extremities without noticeable focal  abnormality PSYCH: pleasant and cooperative, no obvious depression or anxiety  BP Readings from Last 3 Encounters:  05/25/24 122/80  02/04/24 130/65  10/09/23 (!) 148/83    ASSESSMENT AND PLAN:  Discussed the following assessment and plan:  Persistent cough for 3 weeks or longer Prolonged resp infection  poss sinusitis component  vx atypicals   Still could be a viral post viral  but add empiric  antibiotic  Resp optimization;   no other alarm sx  If  persistent or progressive consider chest x ray but  give more time  Hx of what sounds like cough syncope in midst of  worse  event  no residual Time 32 minutes review visit plan and counsel  -Patient advised to return or notify health care team  if  new concerns arise.  Patient Instructions   Change cough  lozenges  .    To sugar free candy to avoid    Cough irritation.    Empiric antibiotic  as planned . (  5 days work for 10 )   If not improving in the next 2 weeks consider getting chest x ray and fu .    Claxton Levitz K. Jerrilynn Mikowski M.D. "
